# Patient Record
Sex: Female | Born: 1990 | Race: White | Hispanic: No | Smoking: Current every day smoker
Health system: Southern US, Community
[De-identification: ages and names within clinical notes are randomized; demographics above are authoritative.]

## PROBLEM LIST (undated history)

## (undated) DIAGNOSIS — F32A Depression, unspecified: Secondary | ICD-10-CM

## (undated) DIAGNOSIS — F329 Major depressive disorder, single episode, unspecified: Secondary | ICD-10-CM

## (undated) DIAGNOSIS — M549 Dorsalgia, unspecified: Secondary | ICD-10-CM

## (undated) DIAGNOSIS — F319 Bipolar disorder, unspecified: Secondary | ICD-10-CM

## (undated) DIAGNOSIS — G8929 Other chronic pain: Secondary | ICD-10-CM

## (undated) HISTORY — DX: Dorsalgia, unspecified: M54.9

## (undated) HISTORY — PX: CHOLECYSTECTOMY: SHX55

## (undated) HISTORY — DX: Major depressive disorder, single episode, unspecified: F32.9

## (undated) HISTORY — PX: TONSILLECTOMY: SUR1361

## (undated) HISTORY — DX: Depression, unspecified: F32.A

## (undated) HISTORY — DX: Other chronic pain: G89.29

## (undated) HISTORY — DX: Bipolar disorder, unspecified: F31.9

---
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
## 2010-11-20 NOTE — Op Note (Signed)
Linda [place], Linda Jackson              ACCOUNT NO.:  [id number]   MEDICAL RECORD NO.:  [id number]          PATIENT TYPE:  INP   LOCATION:  1305                         FACILITY:  [facility]   PHYSICIAN:  John C. Madilyn Fireman, M.D.    DATE OF BIRTH:  October 25, 1990   DATE OF PROCEDURE:  09/17/2007  DATE OF DISCHARGE:                               OPERATIVE REPORT   PROCEDURE:  Endoscopic retrograde cholangiopancreatography with  sphincterotomy and stent placement.   INDICATIONS FOR PROCEDURE:  Bile leak demonstrated on PIPIDA scan x2.   PROCEDURE:  The patient was placed in the prone position and placed on  the pulse monitor with continuous low-flow oxygen delivered by nasal  cannula. She was sedated with 125 mcg IV fentanyl  and 12.5 mg IV  Versed. The Olympus video side-viewing endoscope was advanced blindly  into the oropharynx, esophagus and stomach.  The pylorus was traversed  and papilla of Vater located on the medial duodenal wall and had a  normal appearance.  Decannulation was achieved with the Wilson-Cook  sphincterotome and no dye was injected into the pancreatic duct. The  cholangiogram was obtained which appeared essentially normal. I did not  try to force visualization of the bile leak and did not see any definite  source of the leak. Small sphincterotomy was made and then a 5 cm 10-  French stent was placed with good drainage. There was some debris  yellowish sludge seen to come out through the stent.   IMPRESSION:  Bile leak status post stent placement with otherwise normal  common bile duct but some  small stones or debris seen to empty through  the papilla.   PLAN:  Will leave the stent in place for 1-4 months depending on the  patient's clinical response and then will remove and sweep the duct  clean of any further stone debris.           ______________________________  Linda All. Madilyn Fireman, M.D.     JCH/MEDQ  D:  09/17/2007  T:  09/18/2007  Job:  [id number]

## 2010-11-20 NOTE — Op Note (Signed)
NAMECARMIE, Linda Jackson              ACCOUNT NO.:  [id number]   MEDICAL RECORD NO.:  [id number]          PATIENT TYPE:  AMB   LOCATION:  ENDO                         FACILITY:  [facility]   PHYSICIAN:  John C. Madilyn Fireman, M.D.    DATE OF BIRTH:  October 12, 1990   DATE OF PROCEDURE:  11/26/2007  DATE OF DISCHARGE:                               OPERATIVE REPORT   PROCEDURE:  Endoscopic retrograde cholangiopancreatography with stent  removal.   INDICATIONS FOR PROCEDURE:  Previous bile leak with endoscopic stent  placed in March.   PROCEDURE IN DETAIL:  The patient was placed in the prone position and  placed on the pulse monitor with continuous low flow oxygen delivered by  nasal cannula.  She was sedated with 150 mcg IV fentanyl, 15 mg IV  Versed and 50 mg IV Benadryl.  The Olympus side-viewing endoscope was  advanced under direct vision into the oropharynx, esophagus and stomach.  The pylorus was traversed and papilla of Vater located on the medial  duodenal wall and had normal appearance and a cholangiogram was obtained  through the stent which showed some possible small filling defects  representing either tiny stones or air bubbles.  The stent was then  removed with the snare and the sphincterotome reintroduced and  cholangiogram was obtained again showing very small filling defects,  unclear whether bubbles or stones.  The sphincterotomy was not enlarged.  An adjustable balloon catheter was placed high in the common hepatic  duct and dragged down several times with air bubbles seen to be  delivered but no stones.  After the last attempt an occlusion  cholangiogram was obtained which showed no further filling defect.  The  scope was then withdrawn and the patient returned to their recovery room  in stable condition.  She tolerated the procedure well and there were no  immediate complications.   IMPRESSION:  Successful removal of biliary stent with no definite stones  seen and no evidence of  residual bile leak.           ______________________________  Everardo All. Madilyn Fireman, M.D.     JCH/MEDQ  D:  11/26/2007  T:  11/26/2007  Job:  [id number]   cc:   Erskine Speed, M.D.  Fax: [phone]

## 2010-11-20 NOTE — H&P (Signed)
NAME:  Linda [place], Linda Jackson NO.:  [id number]   MEDICAL RECORD NO.:  [id number]          PATIENT TYPE:  EMS   LOCATION:  ED                           FACILITY:  [facility]   PHYSICIAN:  Bernette Redbird, M.D.   DATE OF BIRTH:  1991/06/09   DATE OF ADMISSION:  09/16/2007  DATE OF DISCHARGE:                              HISTORY & PHYSICAL   This 20 year old female is being admitted to the hospital because of  right upper quadrant pain and a possible bile leak.   Linda [place] is status post a laparoscopic cholecystectomy performed in [facility]  by Dr. Gabriel Cirri on September 04, 2007.  Apparently some stones were  present and the procedure went smoothly in that she was discharged the  same day as the surgery and had what sounds like a very normal  postoperative convalescence. She was thus doing well and off all pain  medications as of several days ago until approximately 2:00 p.m.  yesterday when she had the fairly abrupt onset of right upper quadrant  pain which became increasingly severe and prompted going to the [facility] emergency room.  This pain came on after helping to move some  furniture, although without actual heavy lifting but more just pushing  furniture around their new apartment.   At the [facility] ER, the patient had leukocytosis with a white count of  16,400 but normal liver chemistries and lipase.  Alk phos was minimally  elevated at 133 but I do not consider that significant with other liver  chemistries being normal.   She underwent a hepatobiliary scan which, by verbal report from the ER  physician, showed changes compatible with a bile leak and it was thus  felt that she needed an ERCP and stent placement.  The local  gastroenterologist in [place] is out of town for the week, the  gastroenterologist on call in [place] does not treat patients under  age 50, and thus we were called and asked to assume the patient's care.  Arrangements were thus made for her to be  transferred from the [facility] emergency room to [facility] to our service.   The patient got Vicodin and morphine prior to transfer and at this time  her pain level has dropped from approximately 9 on a scale of 10 to  about 4 or 5.   The pain has not been associated with nausea, vomiting or fevers.   PAST MEDICAL HISTORY:  The patient indicates that she had an allergy to  CEFZIL as young child, characterized by hives. Note that since then, she  has used penicillin and, very recently Augmentin for UTI, without any  allergic reaction.   OUTPATIENT MEDICATIONS:  1. Singulair 10 mg daily.  2. Effexor XL 150 mg daily.  3. Zyrtec 10 mg daily.  4. Relpax 40 mg as needed for migraine.  5. Birth control pills to regulate her cycles.   OPERATIONS:  Myringotomy and tonsillectomy.   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  No coronary disease, COPD, diabetes, hypertension or  other significant medical illnesses.   HABITS:  Nonsmoker, nondrinker.   FAMILY HISTORY:  Apparently a number family members have had gallbladder  trouble.   SOCIAL HISTORY:  The patient is home schooled. Her mother had a brain  tumor and is on disability.  She is being home schooled by her mother  and is at the 11th grade level.   REVIEW OF SYSTEMS:  The patient does not have chronic GI complaints such  as dyspepsia, stomach problems, trouble swallowing, constipation or  diarrhea.   PHYSICAL EXAM:  Linda [place] is a mildly overweight, pleasant, articulate,  Caucasian female really in absolutely no acute distress at the time my  evaluation, sitting up on the side of the bed.  VITAL SIGNS:  Temperature 99.3, blood pressure 126/83, pulse 78,  respirations 20 and unlabored. O2 sat 99%.  HEENT:  The mouth is dry but the oropharynx  is benign.  The sclerae are  anicteric.  She is without evident pallor.  CHEST:  Clear to auscultation, no wheezes.  NECK:  No adenopathy appreciated.  HEART:  Rate approximately 100, no murmurs, gallops or clicks  appreciated. Respiratory variation in heart rate noted.  ABDOMEN:  Mildly adipose, quiet bowel sounds, moderate subjective  tenderness to palpation especially in the right upper quadrant region,  but without evident guarding and without rigidity or rebound. No mass  effect or organomegaly appreciated.  NEUROLOGIC:  The patient is alert, oriented, coherent and appropriate  without obvious cranial nerve or focal motor deficits.   LABORATORY DATA:  Labs obtained approximately 8 hours ago at the  [facility] emergency room are pertinent as mentioned per HPI.  BUN and  creatinine are normal.  Glucose was 118.  Amylase and lipase are normal,  bilirubin is 0.4, AST normal at 21, ALT normal at 24, alk phos slightly  elevated at 133 (normal up to 92), white count 16,400 with hemoglobin of  14.8 and platelets normal at 276,000.  Urine pregnancy test is negative.   IMPRESSION:  Upper abdominal pain of fairly abrupt onset occurring about  10 days following uneventful laparoscopic cholecystectomy.  Positive  HIDA scan raises the question of a dilated bile leak which, considering  the time course, would seem to correlate with perhaps dislodgement of a  clip on the cystic duct rather than any intraoperative injury.  I wonder  if the problem may be resolving on its own in that the patient is now  several hours out from her most recent pain medication dose and yet is  in really no distress and has a fairly benign exam.   PLAN:  1. Await updated labs.  In particular, look for elevation of liver      chemistries which might signal a common duct stone or other form of      obstruction which could precipitate a bile leak.  2. As added evaluation of the patient's abdominal pain, I will obtain       a CT scan this morning.  3. At the moment, I do not see an indication for antibiotic therapy.      If she needs it, Unasyn would probably be a good choice based on      her recent tolerance of Augmentin by her history.  Dr. Bosie Clos      will be making morning rounds a few hours from now and can help  formulate a plan based on her clinical evolution.  I have      discussed,      in detail and with the assistance of a diagram, the nature, purpose      and risks of ERCP sphincterotomy and stent placement with the      patient and her father.  However, it is actually her mother who is      her legal guardian since her mother and father are divorced and      thus consent would have to be obtained from the patient's mother.           ______________________________  Bernette Redbird, M.D.     RB/MEDQ  D:  09/16/2007  T:  09/17/2007  Job:  [id number]   cc:   Samuel Jester  Fax: [phone]   Erskine Speed, M.D.  Fax: [phone]

## 2010-11-23 NOTE — Discharge Summary (Signed)
NAME:  Linda [place], Linda Jackson NO.:  [id number]   MEDICAL RECORD NO.:  [id number]          PATIENT TYPE:  INP   LOCATION:  1305                         FACILITY:  [facility]   PHYSICIAN:  Graylin Shiver, M.D.   DATE OF BIRTH:  1990-11-29   DATE OF ADMISSION:  09/16/2007  DATE OF DISCHARGE:  09/22/2007                               DISCHARGE SUMMARY   REASON FOR ADMISSION:  The patient is a 20 year old female who was  admitted to the hospital because of right upper quadrant pain and a  possible bile leak.  She was status post laparoscopic cholecystectomy on  February 27 by Dr. Gabriel Cirri in [place].  Per history, the surgery went well  but she went to the [facility] emergency room where she was evaluated.  She had a hepatobiliary scan and the verbal report that we got was that  she had a bile leak.  She was transferred here because it was felt she  needed an ERCP.   PHYSICAL:  See history and physical on chart   HOSPITAL COURSE:  The patient underwent a repeat HIDA scan here in the  hospital and she also had a CT scan.  There was, on CT scan, a fluid  collection in the gallbladder fossa.  The repeat HIDA scan here was  positive for bile leak.   The patient underwent ERCP with sphincterotomy and biliary stent  placement by Dr. Dorena Cookey on September 17, 2007.   She did well after the procedure, although she did continue to have some  pains in her abdomen and she was using a moderate amount of narcotic  analgesics.  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  She subsequently did well, however, we were able to get her off of her  IV narcotics.  She was then discharged in good condition on September 22, 2007.   FINAL DIAGNOSIS:  Bile leak.   PROCEDURES:  ERCP with sphincterotomy and biliary stent placement.   PLAN:  Was to discharge home and to follow up with Dr. Dorena Cookey in a  few weeks to discuss the further plan and see how she was doing.  His  initial plan from the  ERCP note was to remove the biliary stent anywhere  from 1 to 4 months postprocedure depending on how she did clinically.  Her diet was as tolerated, activity as tolerated.  She was given a  prescription for few Percocet.           ______________________________  Graylin Shiver, M.D.     SFG/MEDQ  D:  10/02/2007  T:  10/03/2007  Job:  [id number]   cc:   Everardo All. Madilyn Fireman, M.D.  Fax: [phone]   Samuel Jester  Fax: [phone]   Erskine Speed, M.D.  Fax: [phone]

## 2011-04-01 LAB — COMPREHENSIVE METABOLIC PANEL
ALT: 38 — ABNORMAL HIGH
ALT: 40 — ABNORMAL HIGH
ALT: 49 — ABNORMAL HIGH
ALT: 56 — ABNORMAL HIGH
AST: 23
AST: 28
AST: 39 — ABNORMAL HIGH
AST: 91 — ABNORMAL HIGH
AST: 28
Albumin: 2.8 — ABNORMAL LOW
Albumin: 2.8 — ABNORMAL LOW
Albumin: 2.8 — ABNORMAL LOW
Albumin: 3.6
Albumin: 3 — ABNORMAL LOW
Alkaline Phosphatase: 131 — ABNORMAL HIGH
Alkaline Phosphatase: 146 — ABNORMAL HIGH
Alkaline Phosphatase: 162 — ABNORMAL HIGH
Alkaline Phosphatase: 171 — ABNORMAL HIGH
BUN: 3 — ABNORMAL LOW
BUN: <1 — ABNORMAL LOW
BUN: <1 — ABNORMAL LOW
BUN: <1 — ABNORMAL LOW
CO2: 24
CO2: 29
CO2: 30
CO2: 32
CO2: 28
Calcium: 8.6
Calcium: 8.9
Calcium: 9
Calcium: 9.2
Calcium: 9.2
Chloride: 101
Chloride: 102
Chloride: 105
Chloride: 105
Creatinine, Ser: 0.56
Creatinine, Ser: 0.58
Creatinine, Ser: 0.65
Creatinine, Ser: 0.7
Creatinine, Ser: 0.54
Glucose, Bld: 79
Glucose, Bld: 88
Glucose, Bld: 93
Glucose, Bld: 99
Potassium: 3.5
Potassium: 3.6
Potassium: 3.7
Potassium: 3.8
Sodium: 134 — ABNORMAL LOW
Sodium: 137
Sodium: 141
Sodium: 142
Sodium: 140
Total Bilirubin: 0.7
Total Bilirubin: 0.7
Total Bilirubin: 0.9
Total Bilirubin: 1.8 — ABNORMAL HIGH
Total Protein: 5.7 — ABNORMAL LOW
Total Protein: 5.7 — ABNORMAL LOW
Total Protein: 5.8 — ABNORMAL LOW
Total Protein: 7.1
Total Protein: 6

## 2011-04-01 LAB — DIFFERENTIAL
Basophils Absolute: 0
Basophils Absolute: 0
Basophils Absolute: 0.1
Basophils Absolute: 0.1
Basophils Relative: 0
Basophils Relative: 0
Basophils Relative: 1
Basophils Relative: 1
Eosinophils Absolute: 0
Eosinophils Absolute: 0.2
Eosinophils Absolute: 0.3
Eosinophils Absolute: 0.4
Eosinophils Relative: 0
Eosinophils Relative: 2
Eosinophils Relative: 3
Eosinophils Relative: 5
Lymphocytes Relative: 17 — ABNORMAL LOW
Lymphocytes Relative: 30
Lymphocytes Relative: 9 — ABNORMAL LOW
Lymphocytes Relative: 35
Lymphs Abs: 1.6
Lymphs Abs: 1.9
Lymphs Abs: 2.6
Lymphs Abs: 2.9
Monocytes Absolute: 0.9
Monocytes Absolute: 1
Monocytes Absolute: 1.2
Monocytes Absolute: 1.2
Monocytes Relative: 10
Monocytes Relative: 11
Monocytes Relative: 7
Monocytes Relative: 8
Monocytes Relative: 8
Neutro Abs: 15.8 — ABNORMAL HIGH
Neutro Abs: 4.8
Neutro Abs: 7.8
Neutrophils Relative %: 56
Neutrophils Relative %: 70
Neutrophils Relative %: 85 — ABNORMAL HIGH

## 2011-04-01 LAB — CBC
HCT: 34.1 — ABNORMAL LOW
HCT: 35.1 — ABNORMAL LOW
HCT: 35.8 — ABNORMAL LOW
HCT: 42.3
Hemoglobin: 11.9 — ABNORMAL LOW
Hemoglobin: 12.1
Hemoglobin: 12.2
Hemoglobin: 14.8
MCHC: 33.9
MCHC: 34.7
MCHC: 34.9
MCHC: 34.9
MCHC: 35.6
MCV: 89.9
MCV: 90.9
MCV: 91.1
MCV: 91.2
MCV: 89.6
Platelets: 224
Platelets: 245
Platelets: 258
Platelets: 294
Platelets: 295
RBC: 3.75 — ABNORMAL LOW
RBC: 3.85
RBC: 3.93
RBC: 4.7
RDW: 12.1
RDW: 12.2
RDW: 12.4
RDW: 12.4
RDW: 12.1
WBC: 11.1
WBC: 12.2
WBC: 18.6 — ABNORMAL HIGH
WBC: 8.7

## 2011-04-01 LAB — PROTIME-INR
INR: 1
INR: 1.1
Prothrombin Time: 13.8
Prothrombin Time: 14.7

## 2011-04-01 LAB — ABO/RH: ABO/RH(D): O POS

## 2011-04-01 LAB — TYPE AND SCREEN
ABO/RH(D): O POS
Antibody Screen: NEGATIVE

## 2011-04-01 LAB — LIPASE, BLOOD: Lipase: 13

## 2011-04-01 LAB — AMYLASE: Amylase: 45

## 2011-04-03 LAB — DIFFERENTIAL
Basophils Absolute: 0.1
Lymphocytes Relative: 31
Neutro Abs: 6.4

## 2011-04-03 LAB — COMPREHENSIVE METABOLIC PANEL
Albumin: 3.3 — ABNORMAL LOW
BUN: 3 — ABNORMAL LOW
CO2: 25
Chloride: 106
Creatinine, Ser: 0.75
Total Bilirubin: 0.7

## 2011-04-03 LAB — CBC
HCT: 37.3
MCHC: 34.8
MCV: 89.9
Platelets: 272
WBC: 10.7

## 2011-04-03 LAB — LIPASE, BLOOD: Lipase: 14

## 2013-05-13 ENCOUNTER — Encounter (HOSPITAL_COMMUNITY): Payer: Self-pay | Admitting: Emergency Medicine

## 2013-05-13 ENCOUNTER — Emergency Department (HOSPITAL_COMMUNITY)
Admission: EM | Admit: 2013-05-13 | Discharge: 2013-05-13 | Disposition: A | Discharge status: Home / Self Care | Payer: Self-pay | Attending: Emergency Medicine | Admitting: Emergency Medicine

## 2013-05-13 ENCOUNTER — Emergency Department (HOSPITAL_COMMUNITY): Payer: Self-pay

## 2013-05-13 DIAGNOSIS — S300XXA Contusion of lower back and pelvis, initial encounter: Secondary | ICD-10-CM

## 2013-05-13 DIAGNOSIS — R110 Nausea: Secondary | ICD-10-CM | POA: Insufficient documentation

## 2013-05-13 DIAGNOSIS — W1809XA Striking against other object with subsequent fall, initial encounter: Secondary | ICD-10-CM | POA: Insufficient documentation

## 2013-05-13 DIAGNOSIS — Y9389 Activity, other specified: Secondary | ICD-10-CM | POA: Insufficient documentation

## 2013-05-13 DIAGNOSIS — S20229A Contusion of unspecified back wall of thorax, initial encounter: Secondary | ICD-10-CM | POA: Insufficient documentation

## 2013-05-13 DIAGNOSIS — Y92009 Unspecified place in unspecified non-institutional (private) residence as the place of occurrence of the external cause: Secondary | ICD-10-CM | POA: Insufficient documentation

## 2013-05-13 DIAGNOSIS — M5380 Other specified dorsopathies, site unspecified: Secondary | ICD-10-CM | POA: Insufficient documentation

## 2013-05-13 DIAGNOSIS — F17200 Nicotine dependence, unspecified, uncomplicated: Secondary | ICD-10-CM | POA: Insufficient documentation

## 2013-05-13 DIAGNOSIS — W19XXXA Unspecified fall, initial encounter: Secondary | ICD-10-CM

## 2013-05-13 MED ORDER — HYDROCODONE-ACETAMINOPHEN 5-325 MG PO TABS: 1.0000 | ORAL_TABLET | ORAL | Status: DC | PRN

## 2013-05-13 MED ORDER — HYDROCODONE-ACETAMINOPHEN 5-325 MG PO TABS
1.0000 | ORAL_TABLET | Freq: Once | ORAL | Status: AC
  Administered 2013-05-13: 1 via ORAL
  Filled 2013-05-13: qty 1

## 2013-05-13 NOTE — ED Notes (Signed)
Pt was carrying a heavy box and fell, Hit her head, no LOC.  Low back pain.  No neck pain.

## 2013-05-13 NOTE — ED Provider Notes (Signed)
CSN: [id number]     Arrival date & time 05/13/13  1903 History   First MD Initiated Contact with Patient 05/13/13 1934     Chief Complaint  Patient presents with  . Fall   (Consider location/radiation/quality/duration/timing/severity/associated sxs/prior Treatment) Patient is a 22 y.o. female presenting with fall. The history is provided by the patient.  Fall This is a new problem. The current episode started today. The problem occurs constantly. The problem has been unchanged. Associated symptoms include nausea. Pertinent negatives include no abdominal pain, chest pain, chills, fever, headaches, neck pain or vomiting. The symptoms are aggravated by twisting, walking and bending. She has tried nothing for the symptoms.   JULE WHITSEL is a 22 y.o. female who presents to the ED with low back pain after falling approximately 4 hours prior to arrival to the ED. She usually takes extra strength tylenol every day but did not take any today. She states that she was carrying a heavy box and fell. She hit her head on the porch rail and then landed on her lower back. She denies LOC or headache. She denies neck pain. She has had no nausea until after she arrived here and she thinks the pain has made her a little nauseated.   History reviewed. No pertinent past medical history. Past Surgical History  Procedure Laterality Date  . Tonsillectomy    . Cholecystectomy     History reviewed. No pertinent family history. History  Substance Use Topics  . Smoking status: Current Every Day Smoker  . Smokeless tobacco: Not on file  . Alcohol Use: Yes   OB History   Grav Para Term Preterm Abortions TAB SAB Ect Mult Living                 Review of Systems  Constitutional: Negative for fever and chills.  Eyes: Negative for photophobia, pain and visual disturbance.  Respiratory: Negative for shortness of breath.   Cardiovascular: Negative for chest pain.  Gastrointestinal: Positive for nausea. Negative  for vomiting and abdominal pain.  Genitourinary: Negative for dysuria and frequency.  Musculoskeletal: Positive for back pain. Negative for neck pain and neck stiffness.  Skin: Negative for wound.  Allergic/Immunologic: Negative for immunocompromised state.  Neurological: Negative for dizziness, syncope and headaches.  Psychiatric/Behavioral: The patient is not nervous/anxious.     Allergies  Cefzil and Morphine and related  Home Medications  No current outpatient prescriptions on file. BP 118/77  Pulse 77  Temp(Src) 98.1 F (36.7 C) (Oral)  Resp 20  Ht 5\' 1"  (1.549 m)  Wt 179 lb (81.194 kg)  BMI 33.84 kg/m2  SpO2 98%  LMP 05/06/2013 Physical Exam  Nursing note and vitals reviewed. Constitutional: She is oriented to person, place, and time. She appears well-developed and well-nourished.  HENT:  Head: Normocephalic.  Right Ear: Tympanic membrane normal.  Left Ear: Tympanic membrane normal.  Nose: Nose normal.  Mouth/Throat: Uvula is midline, oropharynx is clear and moist and mucous membranes are normal.  Eyes: Conjunctivae and EOM are normal. Pupils are equal, round, and reactive to light.  Neck: Normal range of motion. Neck supple.  Cardiovascular: Normal rate and regular rhythm.   Pulmonary/Chest: Effort normal and breath sounds normal.  Abdominal: Soft. Bowel sounds are normal. There is no tenderness.  Musculoskeletal: Normal range of motion.       Lumbar back: She exhibits tenderness, pain and spasm. She exhibits normal range of motion and normal pulse.       Back:  Hx of scoliosis. Pedal pulses equal, adequate circulation.   Neurological: She is alert and oriented to person, place, and time. She has normal strength and normal reflexes. No cranial nerve deficit or sensory deficit. Gait normal.  Skin: Skin is warm and dry.  Psychiatric: She has a normal mood and affect. Her behavior is normal.    ED Course  Procedures  Dg Lumbar Spine Complete  05/13/2013    CLINICAL DATA:  Acute low back pain following a fall today.  EXAM: LUMBAR SPINE - COMPLETE 4+ VIEW  COMPARISON:  11/30/2011.  FINDINGS: Mild levoconvex curve of the lumbar spine was present on the prior CT from 2013 and is probably normal for this patient. Cholecystectomy clips are present in the right upper quadrant. Vertebral body height and intervertebral disc spaces appear within normal limits. There are no pars defects. The lumbosacral junction appears normal. No sacral fracture is identified.  IMPRESSION: Negative.   Electronically Signed   By: Andreas Newport M.D.   On: 05/13/2013 20:39    MDM  22 y.o. female with low back pain s/p fall a few hours earlier. Patient stable for discharge home without any immediate complications. She remains neurovascularly intact. Will treat pain and she will follow up with her PCP or ortho as needed.  Discussed with the patient and all questioned fully answered. She will return if any problems arise.    Medication List    TAKE these medications       HYDROcodone-acetaminophen 5-325 MG per tablet  Commonly known as:  NORCO/VICODIN  Take 1 tablet by mouth every 4 (four) hours as needed.      ASK your doctor about these medications       acetaminophen 500 MG tablet  Commonly known as:  TYLENOL  Take 2,000 mg by mouth every 12 (twelve) hours as needed for mild pain, moderate pain or headache.     ibuprofen 200 MG tablet  Commonly known as:  ADVIL,MOTRIN  Take 800 mg by mouth 2 (two) times daily as needed for fever, headache, mild pain, moderate pain or cramping.           Janne Napoleon, [place] 05/15/13 [id number]

## 2013-05-15 NOTE — ED Provider Notes (Signed)
Medical screening examination/treatment/procedure(s) were performed by non-physician practitioner and as supervising physician I was immediately available for consultation/collaboration.  EKG Interpretation   None        Geoffery Lyons, MD 05/15/13 2351

## 2013-07-08 NOTE — L&D Delivery Note (Signed)
Delivery Note At 2:48 PM a viable and healthy female was delivered via  (Presentation:cephalic; right occipital anterior).  APGAR: 9,9; weight- pending .   Placenta status: intact,Shultz.  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  Anesthesia:   Episiotomy: none Lacerations: none   Suture Repair: none Est. Blood Loss (mL): 200  Mom and baby doing well, bonding FOB at bedside Mom to postpartum.  Baby to Couplet care / Skin to Skin.  Linda [place] SHWON 06/11/2014, 3:07 PM

## 2013-09-10 ENCOUNTER — Emergency Department (HOSPITAL_COMMUNITY): Payer: Self-pay

## 2013-09-10 ENCOUNTER — Encounter (HOSPITAL_COMMUNITY): Payer: Self-pay | Admitting: Emergency Medicine

## 2013-09-10 ENCOUNTER — Emergency Department (HOSPITAL_COMMUNITY)
Admission: EM | Admit: 2013-09-10 | Discharge: 2013-09-10 | Disposition: A | Discharge status: Home / Self Care | Payer: Self-pay | Attending: Emergency Medicine | Admitting: Emergency Medicine

## 2013-09-10 DIAGNOSIS — Y92009 Unspecified place in unspecified non-institutional (private) residence as the place of occurrence of the external cause: Secondary | ICD-10-CM | POA: Insufficient documentation

## 2013-09-10 DIAGNOSIS — S60511A Abrasion of right hand, initial encounter: Secondary | ICD-10-CM

## 2013-09-10 DIAGNOSIS — S60229A Contusion of unspecified hand, initial encounter: Secondary | ICD-10-CM | POA: Insufficient documentation

## 2013-09-10 DIAGNOSIS — S60221A Contusion of right hand, initial encounter: Secondary | ICD-10-CM

## 2013-09-10 DIAGNOSIS — IMO0002 Reserved for concepts with insufficient information to code with codable children: Secondary | ICD-10-CM | POA: Insufficient documentation

## 2013-09-10 DIAGNOSIS — S8000XA Contusion of unspecified knee, initial encounter: Secondary | ICD-10-CM | POA: Insufficient documentation

## 2013-09-10 DIAGNOSIS — W230XXA Caught, crushed, jammed, or pinched between moving objects, initial encounter: Secondary | ICD-10-CM | POA: Insufficient documentation

## 2013-09-10 DIAGNOSIS — F17200 Nicotine dependence, unspecified, uncomplicated: Secondary | ICD-10-CM | POA: Insufficient documentation

## 2013-09-10 DIAGNOSIS — Z23 Encounter for immunization: Secondary | ICD-10-CM | POA: Insufficient documentation

## 2013-09-10 DIAGNOSIS — S8001XA Contusion of right knee, initial encounter: Secondary | ICD-10-CM

## 2013-09-10 DIAGNOSIS — Y939 Activity, unspecified: Secondary | ICD-10-CM | POA: Insufficient documentation

## 2013-09-10 MED ORDER — ACETAMINOPHEN 325 MG PO TABS
650.0000 mg | ORAL_TABLET | Freq: Once | ORAL | Status: AC
  Administered 2013-09-10: 650 mg via ORAL
  Filled 2013-09-10: qty 2

## 2013-09-10 MED ORDER — IBUPROFEN 800 MG PO TABS
800.0000 mg | ORAL_TABLET | Freq: Once | ORAL | Status: AC
  Administered 2013-09-10: 800 mg via ORAL
  Filled 2013-09-10: qty 1

## 2013-09-10 MED ORDER — TETANUS-DIPHTH-ACELL PERTUSSIS 5-2.5-18.5 LF-MCG/0.5 IM SUSP
0.5000 mL | Freq: Once | INTRAMUSCULAR | Status: AC
  Administered 2013-09-10: 0.5 mL via INTRAMUSCULAR
  Filled 2013-09-10: qty 0.5

## 2013-09-10 NOTE — Discharge Instructions (Signed)
The x-ray of your hand is negative for fracture or dislocation. Your tetanus status has been updated do to the scratches and abrasions of your hand. The x-ray of your right knee is negative for fracture, dislocation, or effusion. Please use ibuprofen every 6 hours for discomfort. May use Tylenol in between the ibuprofen doses if needed. Please allow your hand to rest on the ice pack to prevent further swelling. Please see the orthopedic specialist listed above if not improving. Abrasions An abrasion is a cut or scrape of the skin. Abrasions do not go through all layers of the skin. HOME CARE  If a bandage (dressing) was put on your wound, change it as told by your doctor. If the bandage sticks, soak it off with warm.  Wash the area with water and soap 2 times a day. Rinse off the soap. Pat the area dry with a clean towel.  Put on medicated cream (ointment) as told by your doctor.  Change your bandage right away if it gets wet or dirty.  Only take medicine as told by your doctor.  See your doctor within 24 48 hours to get your wound checked.  Check your wound for redness, puffiness (swelling), or yellowish-white fluid (pus). GET HELP RIGHT AWAY IF:   You have more pain in the wound.  You have redness, swelling, or tenderness around the wound.  You have pus coming from the wound.  You have a fever or lasting symptoms for more than 2 3 days.  You have a fever and your symptoms suddenly get worse.  You have a bad smell coming from the wound or bandage. MAKE SURE YOU:   Understand these instructions.  Will watch your condition.  Will get help right away if you are not doing well or get worse. Document Released: 12/11/2007 Document Revised: 03/18/2012 Document Reviewed: 05/28/2011 [facility] Patient Information 2014 [place], [place].  Contusion A contusion is a deep bruise. Contusions are the result of an injury that caused bleeding under the skin. The contusion may turn blue,  purple, or yellow. Minor injuries will give you a painless contusion, but more severe contusions may stay painful and swollen for a few weeks.  CAUSES  A contusion is usually caused by a blow, trauma, or direct force to an area of the body. SYMPTOMS   Swelling and redness of the injured area.  Bruising of the injured area.  Tenderness and soreness of the injured area.  Pain. DIAGNOSIS  The diagnosis can be made by taking a history and physical exam. An X-ray, CT scan, or MRI may be needed to determine if there were any associated injuries, such as fractures. TREATMENT  Specific treatment will depend on what area of the body was injured. In general, the best treatment for a contusion is resting, icing, elevating, and applying cold compresses to the injured area. Over-the-counter medicines may also be recommended for pain control. Ask your caregiver what the best treatment is for your contusion. HOME CARE INSTRUCTIONS   Put ice on the injured area.  Put ice in a plastic bag.  Place a towel between your skin and the bag.  Leave the ice on for 15-20 minutes, 03-04 times a day.  Only take over-the-counter or prescription medicines for pain, discomfort, or fever as directed by your caregiver. Your caregiver may recommend avoiding anti-inflammatory medicines (aspirin, ibuprofen, and naproxen) for 48 hours because these medicines may increase bruising.  Rest the injured area.  If possible, elevate the injured area to reduce  swelling. SEEK IMMEDIATE MEDICAL CARE IF:   You have increased bruising or swelling.  You have pain that is getting worse.  Your swelling or pain is not relieved with medicines. MAKE SURE YOU:   Understand these instructions.  Will watch your condition.  Will get help right away if you are not doing well or get worse. Document Released: 04/03/2005 Document Revised: 09/16/2011 Document Reviewed: 04/29/2011 [facility] Patient Information 2014 [place], [place].

## 2013-09-10 NOTE — ED Notes (Signed)
Pt seen and evaluated by EDPa for initial assessment. 

## 2013-09-10 NOTE — ED Provider Notes (Signed)
CSN: [id number]     Arrival date & time 09/10/13  1628 History   This chart was scribed for Ivery Quale by Ladona Ridgel Day, ED scribe. This patient was seen in room APFT23/APFT23 and the patient's care was started at 1628.  Chief Complaint  Patient presents with  . Hand Injury   The history is provided by the patient. No language interpreter was used.   HPI Comments: Linda Jackson is a 23 y.o. female who presents to the Emergency Department complaining of sudden onset left hand pain after it was slammed in her front door at home, occurred 3 hours ago and having constant moderate pain to her left hand. She denies any blood thinner medicines or any previous operations/surgeries of her left hand. She denies any other injuries proximal to her left hand.  She reports has not updated her Tdap in the last 5 years.  History reviewed. No pertinent past medical history. Past Surgical History  Procedure Laterality Date  . Tonsillectomy    . Cholecystectomy     History reviewed. No pertinent family history. History  Substance Use Topics  . Smoking status: Current Every Day Smoker  . Smokeless tobacco: Not on file  . Alcohol Use: Yes   OB History   Grav Para Term Preterm Abortions TAB SAB Ect Mult Living                 Review of Systems  Constitutional: Negative for fever and chills.  Respiratory: Negative for cough and shortness of breath.   Cardiovascular: Negative for chest pain.  Gastrointestinal: Negative for abdominal pain.  Musculoskeletal: Negative for back pain.       Left hand pain. Right knee pain  All other systems reviewed and are negative.   Allergies  Morphine and related and Cefzil  Home Medications   Current Outpatient Rx  Name  Route  Sig  Dispense  Refill  . acetaminophen (TYLENOL) 500 MG tablet   Oral   Take 1,000 mg by mouth daily as needed for mild pain, moderate pain or headache.           Triage Vitals: BP 121/80  Pulse 87  Temp(Src) 98.4 F (36.9  C) (Oral)  Resp 20  Ht 5\' 1"  (1.549 m)  Wt 167 lb (75.751 kg)  BMI 31.57 kg/m2  SpO2 98%  LMP 09/01/2013  Physical Exam  Nursing note and vitals reviewed. Constitutional: She is oriented to person, place, and time. She appears well-developed and well-nourished. No distress.  HENT:  Head: Normocephalic and atraumatic.  Eyes: Conjunctivae are normal.  Neck: Normal range of motion.  Cardiovascular: Normal rate.   Pulmonary/Chest: Effort normal. No respiratory distress.  Musculoskeletal: Normal range of motion. She exhibits tenderness. She exhibits no edema.  Full ROM of her left shoulder, no evidnce of dislocation. Full ROM of the left elbow. No pain at the olecranon. No deformity of the forearm. No palpable deformity of the wrist. No pain in the anatomic snuff box.   Abrasions of the 23nd 3rd and 4th finger. Decreased ROM of the PIP joint of the 3rd digit due to pain.  Mild crepitus of the right knee. Patella is midline. No effusion. No posterior mass. No laxity of the joint. Full ROM of the right hip    Neurological: She is alert and oriented to person, place, and time.  Skin: Skin is warm and dry.  Psychiatric: She has a normal mood and affect. Thought content normal.   ED Course  Procedures (including critical care time) DIAGNOSTIC STUDIES: Oxygen Saturation is 98% on room air, normal by my interpretation.    COORDINATION OF CARE: At 505 PM Discussed treatment plan with patient which includes update Tdap and perform left hand X-ray and right knee X-ray. Patient agrees.   605 PM I discussed X-ray results of both her left hand and right knee which demonstrate no acute abnormalities or fracture. She states would also like a work note since she does physical tasks at work. I also encouraged her to apply ice packs over her injuries to aid in recovery and help with pain relief. She understands and is agreeable. She also describes that she is worried about the pain of her right knee  and worried that she may have an underlying problem. I discussed with her that following up with an orthopedist is the next step for evaluating her knee pain.  Labs Review Labs Reviewed - No data to display Imaging Review No results found.   EKG Interpretation None      MDM Pt sustained an injury to the left hand after it was mashed in a house door. The patient states she then" went to her knees" and injured the right knee. X-ray of the left hand is negative for fracture or dislocation. X-ray of the left knee is negative for fracture or dislocation or effusion. The patient states she has been having some problems with her her right knee and is concerned about it hurting when she resumes work. The patient is referred to orthopedics. She's given a work note to return on March 10. Patient is advised to use ibuprofen every 6 hours, she is advised to use Tylenol in between the ibuprofen doses. Questions were answered. Patient seems to understand the discharge plan at this time.    Final diagnoses:  None    *I have reviewed nursing notes, vital signs, and all appropriate lab and imaging results for this patient.** I personally performed the services described in this documentation, which was scribed in my presence. The recorded information has been reviewed and is accurate.      Kathie DikeHobson M Rosalynn Sergent, PA-C 09/10/13 [phone]

## 2013-09-10 NOTE — ED Notes (Signed)
Lt hand injury,  Door slammed shut on lt hand and pt "went down to my knees. ". Pain  Rt knee.

## 2013-09-11 NOTE — ED Provider Notes (Signed)
Medical screening examination/treatment/procedure(s) were performed by non-physician practitioner and as supervising physician I was immediately available for consultation/collaboration.   EKG Interpretation None        Rosio Weiss N Tanner Vigna, DO 09/11/13 0112 

## 2013-11-02 ENCOUNTER — Emergency Department (HOSPITAL_COMMUNITY)
Admission: EM | Admit: 2013-11-02 | Discharge: 2013-11-03 | Disposition: A | Discharge status: Home / Self Care | Payer: Self-pay | Attending: Emergency Medicine | Admitting: Emergency Medicine

## 2013-11-02 ENCOUNTER — Encounter (HOSPITAL_COMMUNITY): Payer: Self-pay | Admitting: Emergency Medicine

## 2013-11-02 DIAGNOSIS — R8271 Bacteriuria: Secondary | ICD-10-CM

## 2013-11-02 DIAGNOSIS — O99330 Smoking (tobacco) complicating pregnancy, unspecified trimester: Secondary | ICD-10-CM | POA: Insufficient documentation

## 2013-11-02 DIAGNOSIS — O9989 Other specified diseases and conditions complicating pregnancy, childbirth and the puerperium: Secondary | ICD-10-CM | POA: Insufficient documentation

## 2013-11-02 DIAGNOSIS — Z3490 Encounter for supervision of normal pregnancy, unspecified, unspecified trimester: Secondary | ICD-10-CM

## 2013-11-02 DIAGNOSIS — R110 Nausea: Secondary | ICD-10-CM | POA: Insufficient documentation

## 2013-11-02 DIAGNOSIS — O99891 Other specified diseases and conditions complicating pregnancy: Secondary | ICD-10-CM | POA: Insufficient documentation

## 2013-11-02 DIAGNOSIS — R109 Unspecified abdominal pain: Secondary | ICD-10-CM | POA: Insufficient documentation

## 2013-11-02 NOTE — ED Provider Notes (Signed)
CSN: [id number]     Arrival date & time 11/02/13  2316 History  This chart was scribed for Vida RollerBrian D Xaivier Malay, MD by Danella Maiersaroline Early, ED Scribe. This patient was seen in room APA09/APA09 and the patient's care was started at 11:25 PM.    Chief Complaint  Patient presents with  . Abdominal Cramping  . pregnant    The history is provided by the patient. No language interpreter was used.   HPI Comments: Linda Jackson is a 23 y.o. female who is 8-[redacted] weeks pregnant based on last period who presents to the Emergency Department complaining of suprapubic abdominal cramping onset yesterday. She states the pain is a constant dull ache with intermittent cramping. She had a positive home pregnancy test. LMP 2/27. G3 P2 2 0 0 2 at 8-9 weeks. Her period is irregular due to depo shot, last shot in September of last year. Food has no effect on the abdominal pain. She states she has been nauseous for the past week and was nauseous with the other pregnancies as well. She denies vaginal discharge, vaginal bleeding, dysuria, vomiting. She has no chronic medical problems. She has not yet seen her OB-GYN but has an appointment. She is not on fertility treatments.    History reviewed. No pertinent past medical history. Past Surgical History  Procedure Laterality Date  . Tonsillectomy    . Cholecystectomy     History reviewed. No pertinent family history. History  Substance Use Topics  . Smoking status: Current Every Day Smoker  . Smokeless tobacco: Not on file  . Alcohol Use: Yes   OB History   Grav Para Term Preterm Abortions TAB SAB Ect Mult Living                 Review of Systems  Gastrointestinal: Positive for nausea and abdominal pain. Negative for vomiting.  Genitourinary: Negative for dysuria, vaginal bleeding and vaginal discharge.  All other systems reviewed and are negative.     Allergies  Morphine and related and Cefzil  Home Medications   Prior to Admission medications   Medication  Sig Start Date End Date Taking? Authorizing Provider  acetaminophen (TYLENOL) 500 MG tablet Take 1,000 mg by mouth daily as needed for mild pain, moderate pain or headache.    Yes Historical Provider, MD   BP 124/86  Pulse 82  Temp(Src) 98.8 F (37.1 C) (Oral)  Resp 20  Ht 5\' 1"  (1.549 m)  Wt 155 lb (70.308 kg)  BMI 29.30 kg/m2  SpO2 100%  LMP 09/03/2013 Physical Exam  Nursing note and vitals reviewed. Constitutional: She appears well-developed and well-nourished. No distress.  HENT:  Head: Normocephalic and atraumatic.  Eyes: Conjunctivae are normal. Right eye exhibits no discharge. Left eye exhibits no discharge.  Cardiovascular: Normal rate and regular rhythm.   No murmur heard. Pulmonary/Chest: Effort normal and breath sounds normal.  Abdominal: Soft. She exhibits no distension. There is tenderness (miminally tenderv SP area). There is no rebound and no guarding.  No significant McBurneys point tenderness  Genitourinary:  Chaperone present for exam.  Mild CMT, green d/c in vag vault, os closed, no bleeding, no FB seen, no adnexal ttp or masses.    Musculoskeletal: She exhibits no edema and no tenderness.  Lymphadenopathy:    She has no cervical adenopathy.  Neurological: She is alert. Coordination normal.  Skin: Skin is warm. No rash noted.    ED Course  Procedures (including critical care time) Medications  acetaminophen (TYLENOL) tablet 1,000  mg (1,000 mg Oral Given 11/03/13 0047)  ondansetron (ZOFRAN-ODT) disintegrating tablet 4 mg (4 mg Oral Given 11/03/13 0048)    DIAGNOSTIC STUDIES:    COORDINATION OF CARE: 12:00 AM- Discussed treatment plan with pt. Performed bedside US  Pt agrees to plan.  EMERGENCY DEPARTMENT US PREGNANCY "Study: Limited Ultrasound of the Pelvis for Pregnancy"  INDICATIONS:Pregnancy(required) and Pelvic pain Multiple views of the uterus and pelvic cavity were obtained in real-time with a multi-frequency probe.  APPROACH:Transabdominal    PERFORMED BY: Myself  IMAGES ARCHIVED?: Yes  LIMITATIONS: Body habitus  PREGNANCY FREE FLUID: None  ADNEXAL FINDINGS:Left ovary not seen and Right ovary not seen  PREGNANCY FINDINGS: Intrauterine gestational sac noted and Fetal pole present  INTERPRETATION: Viable intrauterine pregnancy and Pelvic free fluid absent  GESTATIONAL AGE, ESTIMATE: 7 weeks  FETAL HEART RATE: Not appreciated on bedside US.  Labs Review Labs Reviewed  WET PREP, GENITAL - Abnormal; Notable for the following:    Clue Cells Wet Prep HPF POC FEW (*)    WBC, Wet Prep HPF POC FEW (*)    All other components within normal limits  URINALYSIS, ROUTINE W REFLEX MICROSCOPIC - Abnormal; Notable for the following:    Specific Gravity, Urine <1.005 (*)    Hgb urine dipstick TRACE (*)    Leukocytes, UA SMALL (*)    All other components within normal limits  HCG, QUANTITATIVE, PREGNANCY - Abnormal; Notable for the following:    hCG, Beta Chain, Quant, S 52003 (*)    All other components within normal limits  URINE MICROSCOPIC-ADD ON - Abnormal; Notable for the following:    Squamous Epithelial / LPF MANY (*)    Bacteria, UA MANY (*)    All other components within normal limits  GC/CHLAMYDIA PROBE AMP  URINE CULTURE  RPR  HIV ANTIBODY (ROUTINE TESTING)    Imaging Review No results found.   MDM   Final diagnoses:  Abdominal cramping  Pregnancy  Bacteriuria    The patient is pregnant, her bedside ultrasound reveals an intrauterine pregnancy, verify hCG quantification, urinalysis to rule out infection, pelvic exam to rule out infection, referral to local OB/GYN at the patient's request. Otherwise her abdominal discomfort is only consistent with a pregnancy and much less likely to be related to appendicitis, kidney stone or other significant pathology  Kidney also examined showing no signs of hydronephrosis.  Likely regular intrauterine pregnancy, patient appears benign, anticipate discharge home  with OB/GYN follow up  Bacteriuria present, Cx ordered   I personally performed the services described in this documentation, which was scribed in my presence. The recorded information has been reviewed and is accurate.     Meds given in ED:  Medications  acetaminophen (TYLENOL) tablet 1,000 mg (1,000 mg Oral Given 11/03/13 0047)  ondansetron (ZOFRAN-ODT) disintegrating tablet 4 mg (4 mg Oral Given 11/03/13 0048)    New Prescriptions   NITROFURANTOIN, MACROCRYSTAL-MONOHYDRATE, (MACROBID) 100 MG CAPSULE    Take 1 capsule (100 mg total) by mouth 2 (two) times daily.      Vida RollerBrian D Loomis Anacker, MD 11/03/13 [phone]

## 2013-11-02 NOTE — ED Notes (Signed)
Pt states stated cramping yesterday. Denies vaginal bleeding. Positive pregnant x2 at home

## 2013-11-03 LAB — URINALYSIS, ROUTINE W REFLEX MICROSCOPIC
Bilirubin Urine: NEGATIVE
GLUCOSE, UA: NEGATIVE mg/dL
KETONES UR: NEGATIVE mg/dL
NITRITE: NEGATIVE
PH: 6 (ref 5.0–8.0)
Protein, ur: NEGATIVE mg/dL
Urobilinogen, UA: 0.2 mg/dL (ref 0.0–1.0)

## 2013-11-03 LAB — WET PREP, GENITAL
TRICH WET PREP: NONE SEEN
YEAST WET PREP: NONE SEEN

## 2013-11-03 LAB — URINE MICROSCOPIC-ADD ON

## 2013-11-03 LAB — HCG, QUANTITATIVE, PREGNANCY: HCG, BETA CHAIN, QUANT, S: 52003 m[IU]/mL — AB (ref <5)

## 2013-11-03 LAB — RPR

## 2013-11-03 LAB — HIV ANTIBODY (ROUTINE TESTING W REFLEX): HIV: NONREACTIVE

## 2013-11-03 MED ORDER — ONDANSETRON 4 MG PO TBDP
4.0000 mg | ORAL_TABLET | Freq: Once | ORAL | Status: AC
  Administered 2013-11-03: 4 mg via ORAL
  Filled 2013-11-03: qty 1

## 2013-11-03 MED ORDER — ACETAMINOPHEN 500 MG PO TABS
1000.0000 mg | ORAL_TABLET | Freq: Once | ORAL | Status: AC
  Administered 2013-11-03: 1000 mg via ORAL
  Filled 2013-11-03: qty 2

## 2013-11-03 MED ORDER — NITROFURANTOIN MONOHYD MACRO 100 MG PO CAPS: 100.0000 mg | ORAL_CAPSULE | Freq: Two times a day (BID) | ORAL | Status: DC

## 2013-11-03 NOTE — Discharge Instructions (Signed)
Your ultrasound shows that you are pregnant, you will get a phone call to let you know if you have an STD, see your OBGYN this week for close follow up and to establish care.  Please call your doctor for a followup appointment within 24-48 hours. When you talk to your doctor please let them know that you were seen in the emergency department and have them acquire all of your records so that they can discuss the findings with you and formulate a treatment plan to fully care for your new and ongoing problems.

## 2013-11-04 LAB — URINE CULTURE

## 2013-11-04 LAB — GC/CHLAMYDIA PROBE AMP
CT Probe RNA: NEGATIVE
GC Probe RNA: NEGATIVE

## 2013-11-09 ENCOUNTER — Other Ambulatory Visit: Payer: Self-pay | Admitting: Obstetrics & Gynecology

## 2013-11-09 DIAGNOSIS — O3680X0 Pregnancy with inconclusive fetal viability, not applicable or unspecified: Secondary | ICD-10-CM

## 2013-11-11 ENCOUNTER — Other Ambulatory Visit: Payer: Self-pay

## 2013-12-09 ENCOUNTER — Emergency Department (HOSPITAL_COMMUNITY)
Admission: EM | Admit: 2013-12-09 | Discharge: 2013-12-09 | Disposition: A | Discharge status: Home / Self Care | Payer: Self-pay | Attending: Emergency Medicine | Admitting: Emergency Medicine

## 2013-12-09 ENCOUNTER — Encounter (HOSPITAL_COMMUNITY): Payer: Self-pay | Admitting: Emergency Medicine

## 2013-12-09 DIAGNOSIS — Z79899 Other long term (current) drug therapy: Secondary | ICD-10-CM | POA: Insufficient documentation

## 2013-12-09 DIAGNOSIS — O200 Threatened abortion: Secondary | ICD-10-CM | POA: Insufficient documentation

## 2013-12-09 DIAGNOSIS — O99330 Smoking (tobacco) complicating pregnancy, unspecified trimester: Secondary | ICD-10-CM | POA: Insufficient documentation

## 2013-12-09 LAB — TYPE AND SCREEN
ABO/RH(D): O POS
Antibody Screen: NEGATIVE

## 2013-12-09 LAB — HCG, QUANTITATIVE, PREGNANCY: hCG, Beta Chain, Quant, S: 18581 m[IU]/mL — ABNORMAL HIGH (ref <5)

## 2013-12-09 NOTE — ED Notes (Signed)
Pt reports some abd cramping since yesterday, states tonight approx 6 pm had a small amt of bleeding that has stopped now.  Pt states she is approx 13 weeks preg

## 2013-12-09 NOTE — Discharge Instructions (Signed)
Threatened Miscarriage °Bleeding during the first 20 weeks of pregnancy is common. This is sometimes called a threatened miscarriage. This is a pregnancy that is threatening to end before the twentieth week of pregnancy. Often this bleeding stops with bed rest or decreased activities as suggested by your caregiver and the pregnancy continues without any more problems. You may be asked to not have sexual intercourse, have orgasms or use tampons until further notice. Sometimes a threatened miscarriage can progress to a complete or incomplete miscarriage. This may or may not require further treatment. Some miscarriages occur before a woman misses a menstrual period and knows she is pregnant. °Miscarriages occur in 15 to 20% of all pregnancies and usually occur during the first 13 weeks of the pregnancy. The exact cause of a miscarriage is usually never known. A miscarriage is natures way of ending a pregnancy that is abnormal or would not make it to term. There are some things that may put you at risk to have a miscarriage, such as: °· Hormone problems. °· Infection of the uterus or cervix. °· Chronic illness, diabetes for example, especially if it is not controlled. °· Abnormal shaped uterus. °· Fibroids in the uterus. °· Incompetent cervix (the cervix is too weak to hold the baby). °· Smoking. °· Drinking too much alcohol. It's best not to drink any alcohol when you are pregnant. °· Taking illegal drugs. °TREATMENT  °When a miscarriage becomes complete and all products of conception (all the tissue in the uterus) have been passed, often no treatment is needed. If you think you passed tissue, save it in a container and take it to your doctor for evaluation. If the miscarriage is incomplete (parts of the fetus or placenta remain in the uterus), further treatment may be needed. The most common reason for further treatment is continued bleeding (hemorrhage) because pregnancy tissue did not pass out of the uterus. This  often occurs if a miscarriage is incomplete. Tissue left behind may also become infected. Treatment usually is dilatation and curettage (the removal of the remaining products of pregnancy. This can be done by a simple sucking procedure (suction curettage) or a simple scraping of the inside of the uterus. This may be done in the hospital or in the caregiver's office. This is only done when your caregiver knows that there is no chance for the pregnancy to proceed to term. This is determined by physical examination, negative pregnancy test, falling pregnancy hormone count and/or, an ultrasound revealing a dead fetus. °Miscarriages are often a very emotional time for prospective mothers and fathers. This is not you or your partners fault. It did not occur because of an inadequacy in you or your partner. Nearly all miscarriages occur because the pregnancy has started off wrongly. At least half of these pregnancies have a chromosomal abnormality. It is almost always not inherited. Others may have developmental problems with the fetus or placenta. This does not always show up even when the products miscarried are studied under the microscope. The miscarriage is nearly always not your fault and it is not likely that you could have prevented it from happening. If you are having emotional and grieving problems, talk to your health care provider and even seek counseling, if necessary, before getting pregnant again. You can begin trying for another pregnancy as soon as your caregiver says it is OK. °HOME CARE INSTRUCTIONS  °· Your caregiver may order bed rest depending on how much bleeding and cramping you are having. You may be limited   to only getting up to go to the bathroom. You may be allowed to continue light activity. You may need to make arrangements for the care of your other children and for any other responsibilities.  Keep track of the number of pads you use each day, how often you have to change pads and how  saturated (soaked) they are. Record this information.  DO NOT USE TAMPONS. Do not douche, have sexual intercourse or orgasms until approved by your caregiver.  You may receive a follow up appointment for re-evaluation of your pregnancy and a repeat blood test. Re-evaluation often occurs after 2 days and again in 4 to 6 weeks. It is very important that you follow-up in the recommended time period.  If you are Rh negative and the father is Rh positive or you do not know the fathers' blood type, you may receive a shot (Rh immune globulin) to help prevent abnormal antibodies that can develop and affect the baby in any future pregnancies. SEEK IMMEDIATE MEDICAL CARE IF:  You have severe cramps in your stomach, back, or abdomen.  You have a sudden onset of severe pain in the lower part of your abdomen.  You develop chills.  You run an unexplained temperature of 101 F (38.3 C) or higher.  You pass large clots or tissue. Save any tissue for your caregiver to inspect.  Your bleeding increases or you become light-headed, weak, or have fainting episodes.  You have a gush of fluid from your vagina.  You pass out. This could mean you have a tubal (ectopic) pregnancy. Document Released: 06/24/2005 Document Revised: 09/16/2011 Document Reviewed: 02/08/2008 [facility] Patient Information 2014 [place].  Vaginal Bleeding During Pregnancy, First Trimester A small amount of bleeding (spotting) from the vagina is relatively common in early pregnancy. It usually stops on its own. Various things may cause bleeding or spotting in early pregnancy. Some bleeding may be related to the pregnancy, and some may not. In most cases, the bleeding is normal and is not a problem. However, bleeding can also be a sign of something serious. Be sure to tell your health care provider about any vaginal bleeding right away. Some possible causes of vaginal bleeding during the first trimester include:  Infection or  inflammation of the cervix.  Growths (polyps) on the cervix.  Miscarriage or threatened miscarriage.  Pregnancy tissue has developed outside of the uterus and in a fallopian tube (tubal pregnancy).  Tiny cysts have developed in the uterus instead of pregnancy tissue (molar pregnancy). HOME CARE INSTRUCTIONS  Watch your condition for any changes. The following actions may help to lessen any discomfort you are feeling:  Follow your health care provider's instructions for limiting your activity. If your health care provider orders bed rest, you may need to stay in bed and only get up to use the bathroom. However, your health care provider may allow you to continue light activity.  If needed, make plans for someone to help with your regular activities and responsibilities while you are on bed rest.  Keep track of the number of pads you use each day, how often you change pads, and how soaked (saturated) they are. Write this down.  Do not use tampons. Do not douche.  Do not have sexual intercourse or orgasms until approved by your health care provider.  If you pass any tissue from your vagina, save the tissue so you can show it to your health care provider.  Only take over-the-counter or prescription medicines as directed  by your health care provider. °· Do not take aspirin because it can make you bleed. °· Keep all follow-up appointments as directed by your health care provider. °SEEK MEDICAL CARE IF: °· You have any vaginal bleeding during any part of your pregnancy. °· You have cramps or labor pains. °SEEK IMMEDIATE MEDICAL CARE IF:  °· You have severe cramps in your back or belly (abdomen). °· You have a fever, not controlled by medicine. °· You pass large clots or tissue from your vagina. °· Your bleeding increases. °· You feel lightheaded or weak, or you have fainting episodes. °· You have chills. °· You are leaking fluid or have a gush of fluid from your vagina. °· You pass out while having  a bowel movement. °MAKE SURE YOU: °· Understand these instructions. °· Will watch your condition. °· Will get help right away if you are not doing well or get worse. °Document Released: 04/03/2005 Document Revised: 04/14/2013 Document Reviewed: 03/01/2013 °[employer]® Patient Information ©2014 [employer]. ° °

## 2013-12-19 NOTE — ED Provider Notes (Signed)
CSN: [id number]     Arrival date & time 12/09/13  0026 History   First MD Initiated Contact with Patient 12/09/13 0052     No chief complaint on file.    (Consider location/radiation/quality/duration/timing/severity/associated sxs/prior Treatment) HPI  23 year old female with vaginal bleeding. Patient thinks she is approximately [redacted] weeks pregnant. Yesterday she began having some lower cramping. Tonight around 6 PM showed small amount of bright red vaginal bleeding. It has since stopped. No other acute complaints. No dizziness, lightheadedness or shortness of breath. No blood thinners.  History reviewed. No pertinent past medical history. Past Surgical History  Procedure Laterality Date  . Tonsillectomy    . Cholecystectomy     No family history on file. History  Substance Use Topics  . Smoking status: Current Every Day Smoker  . Smokeless tobacco: Not on file  . Alcohol Use: Yes   OB History   Grav Para Term Preterm Abortions TAB SAB Ect Mult Living   1              Review of Systems  All systems reviewed and negative, other than as noted in HPI.   Allergies  Morphine and related and Cefzil  Home Medications   Prior to Admission medications   Medication Sig Start Date End Date Taking? Authorizing Provider  diphenhydrAMINE (SOMINEX) 25 MG tablet Take 25 mg by mouth at bedtime as needed for sleep.   Yes Historical Provider, MD  acetaminophen (TYLENOL) 500 MG tablet Take 1,000 mg by mouth daily as needed for mild pain, moderate pain or headache.     Historical Provider, MD  nitrofurantoin, macrocrystal-monohydrate, (MACROBID) 100 MG capsule Take 1 capsule (100 mg total) by mouth 2 (two) times daily. 11/03/13   Vida RollerBrian D Miller, MD   BP 119/77  Pulse 97  Temp(Src) 98.9 F (37.2 C) (Oral)  Resp 16  Ht 5\' 1"  (1.549 m)  Wt 160 lb (72.576 kg)  BMI 30.25 kg/m2  SpO2 100%  LMP 09/22/2013 Physical Exam  Nursing note and vitals reviewed. Constitutional: She appears  well-developed and well-nourished. No distress.  HENT:  Head: Normocephalic and atraumatic.  Eyes: Conjunctivae are normal. Right eye exhibits no discharge. Left eye exhibits no discharge.  Neck: Neck supple.  Cardiovascular: Normal rate, regular rhythm and normal heart sounds.  Exam reveals no gallop and no friction rub.   No murmur heard. Pulmonary/Chest: Effort normal and breath sounds normal. No respiratory distress.  Abdominal: Soft. She exhibits no distension. There is no tenderness.  Genitourinary:  No costovertebral angle tenderness  Musculoskeletal: She exhibits no edema and no tenderness.  Neurological: She is alert.  Skin: Skin is warm and dry.  Psychiatric: She has a normal mood and affect. Her behavior is normal. Thought content normal.    ED Course  Procedures (including critical care time) Labs Review Labs Reviewed  HCG, QUANTITATIVE, PREGNANCY - Abnormal; Notable for the following:    hCG, Beta Chain, Quant, S 18581 (*)    All other components within normal limits  TYPE AND SCREEN    Imaging Review No results found.   EKG Interpretation None      MDM   Final diagnoses:  Threatened miscarriage    23 year old female with late first trimester vaginal bleeding. Bedside ultrasound shows an IUP with fetal movement and fetal heart rate approximately 140-150. Abdominal exam is benign. She is hemodynamically stable. Return precautions discussed. OB followup.    Raeford RazorStephen Samaa Ueda, MD 12/19/13 [phone] [id number]

## 2013-12-21 ENCOUNTER — Encounter (HOSPITAL_COMMUNITY): Payer: Self-pay | Admitting: Emergency Medicine

## 2013-12-21 ENCOUNTER — Emergency Department (HOSPITAL_COMMUNITY)
Admission: EM | Admit: 2013-12-21 | Discharge: 2013-12-21 | Disposition: A | Discharge status: Home / Self Care | Payer: Self-pay | Attending: Emergency Medicine | Admitting: Emergency Medicine

## 2013-12-21 DIAGNOSIS — R110 Nausea: Secondary | ICD-10-CM | POA: Insufficient documentation

## 2013-12-21 DIAGNOSIS — O99330 Smoking (tobacco) complicating pregnancy, unspecified trimester: Secondary | ICD-10-CM | POA: Insufficient documentation

## 2013-12-21 DIAGNOSIS — O209 Hemorrhage in early pregnancy, unspecified: Secondary | ICD-10-CM | POA: Insufficient documentation

## 2013-12-21 DIAGNOSIS — R197 Diarrhea, unspecified: Secondary | ICD-10-CM | POA: Insufficient documentation

## 2013-12-21 DIAGNOSIS — R109 Unspecified abdominal pain: Secondary | ICD-10-CM | POA: Insufficient documentation

## 2013-12-21 DIAGNOSIS — O9989 Other specified diseases and conditions complicating pregnancy, childbirth and the puerperium: Secondary | ICD-10-CM | POA: Insufficient documentation

## 2013-12-21 DIAGNOSIS — Z79899 Other long term (current) drug therapy: Secondary | ICD-10-CM | POA: Insufficient documentation

## 2013-12-21 DIAGNOSIS — Z3490 Encounter for supervision of normal pregnancy, unspecified, unspecified trimester: Secondary | ICD-10-CM

## 2013-12-21 LAB — WET PREP, GENITAL
CLUE CELLS WET PREP: NONE SEEN
TRICH WET PREP: NONE SEEN
Yeast Wet Prep HPF POC: NONE SEEN

## 2013-12-21 LAB — URINALYSIS, ROUTINE W REFLEX MICROSCOPIC
Bilirubin Urine: NEGATIVE
Glucose, UA: NEGATIVE mg/dL
Hgb urine dipstick: NEGATIVE
KETONES UR: NEGATIVE mg/dL
LEUKOCYTES UA: NEGATIVE
NITRITE: NEGATIVE
PH: 6 (ref 5.0–8.0)
Protein, ur: NEGATIVE mg/dL
Specific Gravity, Urine: 1.02 (ref 1.005–1.030)
Urobilinogen, UA: 0.2 mg/dL (ref 0.0–1.0)

## 2013-12-21 NOTE — ED Notes (Signed)
Pt states is [redacted] weeks pregnant and started having bright red vaginal bleeding today. C/o abd pain.

## 2013-12-21 NOTE — ED Provider Notes (Signed)
CSN: [id number]     Arrival date & time 12/21/13  1811 History   .This chart was scribed for Ward Givens, MD by Nicholos Johns, ED scribe. This patient was seen in room APA09/APA09 and the patient's care was started at 7:28 PM.    Chief Complaint  Patient presents with  . Vaginal Bleeding  . Routine Prenatal Visit    The history is provided by the patient. No language interpreter was used.   HPI Comments: Linda Jackson is a 23 y.o. female at [redacted] weeks pregnant presents to the Emergency Department complaining of heavy bright red vaginal bleeding; onset 2.5 hours ago at 5 pm tonight. Also reports dull RUQ pain and abdominal pressure. 2 episodes of diarrhea; onset last night. Normal nausea throughout pregnancy. Pt is G3P2Ab0. States her LNP started the last week of February.  Reports severe bleeding during the middle and end of the second pregnancy; states the placenta detached 1 week before her due date. That child is now 16-year-old. Has had no prenatal care for this pregnancy due to lack on insurance. Called Dr. Rayna Sexton office to get an appointment but was told she needed to have insurance before being seen. Prior pregnancies were followed by women's health in [place]. LNMP was in the last week of February. Home test came back positive; pregnancy confirmed at this hospital. Smokes half a pack of cigarettes a day. 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 No chronic medical conditions or medications. Denies back pain, vomiting or fever. Patient states she had some spotting and was seen in the ED on June 4. She also was seen for abdominal cramping in April 28.  PCP none OB none  History reviewed. No pertinent past medical history. Past Surgical History  Procedure Laterality Date  . Tonsillectomy    . Cholecystectomy     History reviewed. No pertinent family history. History  Substance Use Topics  . Smoking status: Current Every Day Smoker  . Smokeless tobacco: Not on file  . Alcohol Use: Yes    Smokes one half pack per day Stay at home mom  OB History   Grav Para Term Preterm Abortions TAB SAB Ect Mult Living   3 2             Review of Systems  Constitutional: Negative for fever.  Gastrointestinal: Positive for nausea, abdominal pain and diarrhea. Negative for vomiting.  Genitourinary: Positive for vaginal bleeding.  Musculoskeletal: Negative for back pain.  All other systems reviewed and are negative.  Allergies  Morphine and related and Cefzil  Home Medications   Prior to Admission medications   Medication Sig Start Date End Date Taking? Authorizing Provider  acetaminophen (TYLENOL) 500 MG tablet Take 1,000 mg by mouth daily as needed for mild pain, moderate pain or headache.    Yes Historical Provider, MD  diphenhydrAMINE (SOMINEX) 25 MG tablet Take 25 mg by mouth at bedtime as needed for sleep.   Yes Historical Provider, MD   Triage vitals: Pulse 86  Temp(Src) 98.8 F (37.1 C) (Oral)  Resp 18  SpO2 99%  LMP 09/22/2013  Physical Exam  Nursing note and vitals reviewed. Constitutional: She is oriented to person, place, and time. She appears well-developed and well-nourished.  Non-toxic appearance. She does not appear ill. No distress.  HENT:  Head: Normocephalic and atraumatic.  Right Ear: External ear normal.  Left Ear: External ear normal.  Nose: Nose normal. No mucosal edema or rhinorrhea.  Mouth/Throat: Oropharynx is clear and moist  and mucous membranes are normal. No dental abscesses or uvula swelling.  Eyes: Conjunctivae and EOM are normal. Pupils are equal, round, and reactive to light.  Neck: Normal range of motion and full passive range of motion without pain. Neck supple.  Cardiovascular: Normal rate, regular rhythm and normal heart sounds.  Exam reveals no gallop and no friction rub.   No murmur heard. Pulmonary/Chest: Effort normal and breath sounds normal. No respiratory distress. She has no wheezes. She has no rhonchi. She has no rales. She  exhibits no tenderness and no crepitus.  Abdominal: Soft. Normal appearance and bowel sounds are normal. She exhibits no distension. There is tenderness. There is no rebound and no guarding.  Dull RUQ tenderness  Genitourinary:  Gravid. Normal external genitalia. Moderate thick which cottage cheese type discharge in vault. No blood. Mildly tender diffusely. Cervix is closed and tight.   Musculoskeletal: Normal range of motion. She exhibits no edema and no tenderness.  Moves all extremities well.   Neurological: She is alert and oriented to person, place, and time. She has normal strength. No cranial nerve deficit.  Skin: Skin is warm, dry and intact. No rash noted. No erythema. No pallor.  Psychiatric: She has a normal mood and affect. Her speech is normal and behavior is normal. Her mood appears not anxious.    ED Course  Procedures (including critical care time) Medications - No data to display  DIAGNOSTIC STUDIES: Oxygen Saturation is 99% on room air, normal by my interpretation.    COORDINATION OF CARE: At 7:47 PM: Discussed treatment plan with patient which includes a pelvic exam. Patient agrees.     Bedside ultrasound done. Patient has active fetal movements, fetal heart rate was 147.  Discussed with patient the source of her bleeding is not known. She does not have a UTI, there was no blood seen in her vaginal vault on exam. She did have a lot of white discharge.   Review of prior chart shows patient has not had a formal ultrasound done. She is O+ in blood type.  10:18 PM: Upon recheck, pt is doing well. Discussed lab results. Will be discharged.   Labs Review Results for orders placed during the hospital encounter of 12/21/13  WET PREP, GENITAL      Result Value Ref Range   Yeast Wet Prep HPF POC NONE SEEN  NONE SEEN   Trich, Wet Prep NONE SEEN  NONE SEEN   Clue Cells Wet Prep HPF POC NONE SEEN  NONE SEEN   WBC, Wet Prep HPF POC FEW (*) NONE SEEN  URINALYSIS, ROUTINE W  REFLEX MICROSCOPIC      Result Value Ref Range   Color, Urine YELLOW  YELLOW   APPearance CLEAR  CLEAR   Specific Gravity, Urine 1.020  1.005 - 1.030   pH 6.0  5.0 - 8.0   Glucose, UA NEGATIVE  NEGATIVE mg/dL   Hgb urine dipstick NEGATIVE  NEGATIVE   Bilirubin Urine NEGATIVE  NEGATIVE   Ketones, ur NEGATIVE  NEGATIVE mg/dL   Protein, ur NEGATIVE  NEGATIVE mg/dL   Urobilinogen, UA 0.2  0.0 - 1.0 mg/dL   Nitrite NEGATIVE  NEGATIVE   Leukocytes, UA NEGATIVE  NEGATIVE   No results found.   Imaging Review No results found.   EKG Interpretation None      MDM   Final diagnoses:  Vaginal bleeding before [redacted] weeks gestation  Pregnancy    OTC PNV  Plan discharge  Devoria AlbeIva Knapp, MD, Armando GangFACEP  I personally performed the services described in this documentation, which was scribed in my presence. The recorded information has been reviewed and considered.  Devoria AlbeIva Knapp, MD, FACEP     Ward GivensIva L Knapp, MD 12/22/13 Lyda [id number]

## 2013-12-21 NOTE — ED Notes (Signed)
Patient would like to go home at this time. MD aware.

## 2013-12-21 NOTE — Discharge Instructions (Signed)
Your urine tonight was normal, no blood or urinary tract infection. I did not see any blood on your vaginal exam tonight.  Return if you start having more pain or bleeding like a period. No sex, tampons, douching until you are rechecked by an obstetrician. Call [facility] to start getting your prenatal care. Start taking prenatal vitamins OTC.    Pelvic Rest Pelvic rest is sometimes recommended for women when:   The placenta is partially or completely covering the opening of the cervix (placenta previa).  There is bleeding between the uterine wall and the amniotic sac in the first trimester (subchorionic hemorrhage).  The cervix begins to open without labor starting (incompetent cervix, cervical insufficiency).  The labor is too early (preterm labor). HOME CARE INSTRUCTIONS  Do not have sexual intercourse, stimulation, or an orgasm.  Do not use tampons, douche, or put anything in the vagina.  Do not lift anything over 10 pounds (4.5 kg).  Avoid strenuous activity or straining your pelvic muscles. SEEK MEDICAL CARE IF:  You have any vaginal bleeding during pregnancy. Treat this as a potential emergency.  You have cramping pain felt low in the stomach (stronger than menstrual cramps).  You notice vaginal discharge (watery, mucus, or bloody).  You have a low, dull backache.  There are regular contractions or uterine tightening. SEEK IMMEDIATE MEDICAL CARE IF: You have vaginal bleeding and have placenta previa.  Document Released: 10/19/2010 Document Revised: 09/16/2011 Document Reviewed: 10/19/2010 [facility] Patient Information 2014 [employer] [place].

## 2013-12-23 LAB — GC/CHLAMYDIA PROBE AMP
CT Probe RNA: NEGATIVE
GC PROBE AMP APTIMA: NEGATIVE

## 2014-01-13 ENCOUNTER — Other Ambulatory Visit: Payer: Self-pay | Admitting: Obstetrics & Gynecology

## 2014-01-13 DIAGNOSIS — O0932 Supervision of pregnancy with insufficient antenatal care, second trimester: Secondary | ICD-10-CM

## 2014-01-17 ENCOUNTER — Ambulatory Visit (INDEPENDENT_AMBULATORY_CARE_PROVIDER_SITE_OTHER): Payer: Medicaid Other

## 2014-01-17 ENCOUNTER — Other Ambulatory Visit: Payer: Self-pay | Admitting: Obstetrics & Gynecology

## 2014-01-17 ENCOUNTER — Encounter: Payer: Self-pay | Admitting: Obstetrics & Gynecology

## 2014-01-17 DIAGNOSIS — Z1389 Encounter for screening for other disorder: Secondary | ICD-10-CM

## 2014-01-17 DIAGNOSIS — O0930 Supervision of pregnancy with insufficient antenatal care, unspecified trimester: Secondary | ICD-10-CM

## 2014-01-17 DIAGNOSIS — O0932 Supervision of pregnancy with insufficient antenatal care, second trimester: Secondary | ICD-10-CM

## 2014-01-17 DIAGNOSIS — Z363 Encounter for antenatal screening for malformations: Secondary | ICD-10-CM

## 2014-01-17 NOTE — Progress Notes (Signed)
U/S(19+3wks)-active fetus, meas c/w ED dates, FHR- 134bpm, fluid wnl, anterior Gr 0 placenta, cx appears closed (4+cm), bilateral adnexa appears WNL, female fetus, no obvious abnl noted although sub-optimal views due to fetal position and maternal body habitus

## 2014-01-19 ENCOUNTER — Encounter: Payer: Medicaid Other | Admitting: Women's Health

## 2014-01-31 ENCOUNTER — Encounter: Payer: Self-pay | Admitting: Women's Health

## 2014-01-31 ENCOUNTER — Ambulatory Visit (INDEPENDENT_AMBULATORY_CARE_PROVIDER_SITE_OTHER): Payer: Medicaid Other | Admitting: Women's Health

## 2014-01-31 VITALS — BP 110/60 | Wt 176.0 lb

## 2014-01-31 DIAGNOSIS — F32A Depression, unspecified: Secondary | ICD-10-CM | POA: Insufficient documentation

## 2014-01-31 DIAGNOSIS — Z3480 Encounter for supervision of other normal pregnancy, unspecified trimester: Secondary | ICD-10-CM | POA: Insufficient documentation

## 2014-01-31 DIAGNOSIS — Z3482 Encounter for supervision of other normal pregnancy, second trimester: Secondary | ICD-10-CM

## 2014-01-31 DIAGNOSIS — F411 Generalized anxiety disorder: Secondary | ICD-10-CM | POA: Insufficient documentation

## 2014-01-31 DIAGNOSIS — O0930 Supervision of pregnancy with insufficient antenatal care, unspecified trimester: Secondary | ICD-10-CM

## 2014-01-31 DIAGNOSIS — F418 Other specified anxiety disorders: Secondary | ICD-10-CM | POA: Insufficient documentation

## 2014-01-31 DIAGNOSIS — F17200 Nicotine dependence, unspecified, uncomplicated: Secondary | ICD-10-CM | POA: Insufficient documentation

## 2014-01-31 DIAGNOSIS — O99330 Smoking (tobacco) complicating pregnancy, unspecified trimester: Secondary | ICD-10-CM

## 2014-01-31 DIAGNOSIS — Z331 Pregnant state, incidental: Secondary | ICD-10-CM

## 2014-01-31 DIAGNOSIS — F329 Major depressive disorder, single episode, unspecified: Secondary | ICD-10-CM | POA: Insufficient documentation

## 2014-01-31 DIAGNOSIS — Z1389 Encounter for screening for other disorder: Secondary | ICD-10-CM

## 2014-01-31 LAB — CBC
HEMATOCRIT: 35.7 % — AB (ref 36.0–46.0)
Hemoglobin: 12.3 g/dL (ref 12.0–15.0)
MCH: 31.7 pg (ref 26.0–34.0)
MCHC: 34.5 g/dL (ref 30.0–36.0)
MCV: 92 fL (ref 78.0–100.0)
Platelets: 220 10*3/uL (ref 150–400)
RBC: 3.88 MIL/uL (ref 3.87–5.11)
RDW: 15.2 % (ref 11.5–15.5)
WBC: 14.4 10*3/uL — AB (ref 4.0–10.5)

## 2014-01-31 LAB — POCT URINALYSIS DIPSTICK
GLUCOSE UA: NEGATIVE
Ketones, UA: NEGATIVE
Nitrite, UA: NEGATIVE
RBC UA: NEGATIVE

## 2014-01-31 MED ORDER — ESCITALOPRAM OXALATE 10 MG PO TABS: 10.0000 mg | ORAL_TABLET | Freq: Every day | ORAL | Status: DC

## 2014-01-31 NOTE — Progress Notes (Signed)
Subjective:  Linda Jackson is a 23 y.o. 233P2002 Caucasian female at 6569w3d by 19wk u/s, being seen today for her first obstetrical visit.  Her obstetrical history is significant for smoker, obesity, h/o partial abruption last pregnancy, 2 term SVDs. Has daughters that are 1 & 24yo.  [place], switched here for this pregnancy b/c she was unsatisfied w/ care in last pregnancy. Was smoking 1ppd, now down to 1/2ppd. Pregnancy history fully reviewed.  Patient reports h/o depression, hasn't been on meds since right after birth of last baby 1 yr ago- cries all the time, angry a lot, not eating well. Denies SI/HI/II. Does not currently have mental health provider. Requests restarting meds. Denies vb, cramping, uti s/s, abnormal/malodorous vag d/c, or vulvovaginal itching/irritation.  BP 110/60  Wt 176 lb (79.833 kg)  LMP 09/22/2013  HISTORY: OB History  Gravida Para Term Preterm AB SAB TAB Ectopic Multiple Living  3 2 2       2     # Outcome Date GA Lbr Len/2nd Weight Sex Delivery Anes PTL Lv  3 CUR           2 TRM 09/28/12 5313w0d  8 lb 7 oz (3.827 kg) F SVD EPI N Y  1 TRM 11/21/11 5250w6d  8 lb 3 oz (3.714 kg) F SVD EPI N Y     Past Medical History  Diagnosis Date  . Depression   . Bipolar 1 disorder   . Chronic back pain    Past Surgical History  Procedure Laterality Date  . Tonsillectomy    . Cholecystectomy     Family History  Problem Relation Age of Onset  . Depression Mother   . Kidney disease Mother   . Stroke Mother   . ADD / ADHD Brother   . COPD Maternal Grandmother   . Cancer Maternal Grandfather     brain  . Diabetes Paternal Grandfather   . Hypertension Paternal Grandfather     Exam   System:     General: Well developed & nourished, no acute distress   Skin: Warm & dry, normal coloration and turgor, no rashes   Neurologic: Alert & oriented, normal mood   Cardiovascular: Regular rate & rhythm   Respiratory: Effort & rate normal, LCTAB,  acyanotic   Abdomen: Soft, non tender   Extremities: normal strength, tone   Pelvic Exam:    Perineum: Normal perineum   Vulva: Normal, no lesions   Vagina:  Normal mucosa, normal discharge   Cervix: Normal, bulbous, appears closed   Uterus: Normal size/shape/contour for GA   [facility] pt FHR: 140 via doppler   Assessment:   Pregnancy: [id number] Patient Active Problem List   Diagnosis Date Noted  . Supervision of other normal pregnancy 01/31/2014    Priority: High  . Late prenatal care affecting pregnancy 01/31/2014    Priority: High    7569w3d G3P2002 New OB visit Depression Smoker Obesity Late pnc   Plan:  Initial labs drawn Continue prenatal vitamins Problem list reviewed and updated Reviewed warning s/s to report Reviewed recommended weight gain based on pre-gravid BMI Encouraged well-balanced diet Genetic Screening discussed Quad Screen: declined Cystic fibrosis screening discussed declined Ultrasound discussed; fetal survey: results reviewed Follow up in 3 weeks for visit Smokes 1/2ppd, advised cessation, discussed risks to fetus while pregnant, to infant pp, and to herself. Offered QuitlineNC, declined    [facility] completed Rx Lexapro 10mg  daily, [facility] Repeat anatomy u/s @ 28wks d/t suboptimal views Get pap results from [facility]  Marge Duncans CNM, [facility] 01/31/2014 10:20 AM

## 2014-01-31 NOTE — Patient Instructions (Signed)
Second Trimester of Pregnancy The second trimester is from week 13 through week 28, months 4 through 6. The second trimester is often a time when you feel your best. Your body has also adjusted to being pregnant, and you begin to feel better physically. Usually, morning sickness has lessened or quit completely, you may have more energy, and you may have an increase in appetite. The second trimester is also a time when the fetus is growing rapidly. At the end of the sixth month, the fetus is about 9 inches long and weighs about 1 pounds. You will likely begin to feel the baby move (quickening) between 18 and 20 weeks of the pregnancy. BODY CHANGES Your body goes through many changes during pregnancy. The changes vary from woman to woman.   Your weight will continue to increase. You will notice your lower abdomen bulging out.  You may begin to get stretch marks on your hips, abdomen, and breasts.  You may develop headaches that can be relieved by medicines approved by your health care provider.  You may urinate more often because the fetus is pressing on your bladder.  You may develop or continue to have heartburn as a result of your pregnancy.  You may develop constipation because certain hormones are causing the muscles that push waste through your intestines to slow down.  You may develop hemorrhoids or swollen, bulging veins (varicose veins).  You may have back pain because of the weight gain and pregnancy hormones relaxing your joints between the bones in your pelvis and as a result of a shift in weight and the muscles that support your balance.  Your breasts will continue to grow and be tender.  Your gums may bleed and may be sensitive to brushing and flossing.  Dark spots or blotches (chloasma, mask of pregnancy) may develop on your face. This will likely fade after the baby is born.  A dark line from your belly button to the pubic area (linea nigra) may appear. This will likely fade  after the baby is born.  You may have changes in your hair. These can include thickening of your hair, rapid growth, and changes in texture. Some women also have hair loss during or after pregnancy, or hair that feels dry or thin. Your hair will most likely return to normal after your baby is born. WHAT TO EXPECT AT YOUR PRENATAL VISITS During a routine prenatal visit:  You will be weighed to make sure you and the fetus are growing normally.  Your blood pressure will be taken.  Your abdomen will be measured to track your baby's growth.  The fetal heartbeat will be listened to.  Any test results from the previous visit will be discussed. Your health care provider may ask you:  How you are feeling.  If you are feeling the baby move.  If you have had any abnormal symptoms, such as leaking fluid, bleeding, severe headaches, or abdominal cramping.  If you have any questions. Other tests that may be performed during your second trimester include:  Blood tests that check for:  Low iron levels (anemia).  Gestational diabetes (between 24 and 28 weeks).  Rh antibodies.  Urine tests to check for infections, diabetes, or protein in the urine.  An ultrasound to confirm the proper growth and development of the baby.  An amniocentesis to check for possible genetic problems.  Fetal screens for spina bifida and Down syndrome. HOME CARE INSTRUCTIONS   Avoid all smoking, herbs, alcohol, and unprescribed   drugs. These chemicals affect the formation and growth of the baby.  Follow your health care provider's instructions regarding medicine use. There are medicines that are either safe or unsafe to take during pregnancy.  Exercise only as directed by your health care provider. Experiencing uterine cramps is a good sign to stop exercising.  Continue to eat regular, healthy meals.  Wear a good support bra for breast tenderness.  Do not use hot tubs, steam rooms, or saunas.  Wear your  seat belt at all times when driving.  Avoid raw meat, uncooked cheese, cat litter boxes, and soil used by cats. These carry germs that can cause birth defects in the baby.  Take your prenatal vitamins.  Try taking a stool softener (if your health care provider approves) if you develop constipation. Eat more high-fiber foods, such as fresh vegetables or fruit and whole grains. Drink plenty of fluids to keep your urine clear or pale yellow.  Take warm sitz baths to soothe any pain or discomfort caused by hemorrhoids. Use hemorrhoid cream if your health care provider approves.  If you develop varicose veins, wear support hose. Elevate your feet for 15 minutes, 3-4 times a day. Limit salt in your diet.  Avoid heavy lifting, wear low heel shoes, and practice good posture.  Rest with your legs elevated if you have leg cramps or low back pain.  Visit your dentist if you have not gone yet during your pregnancy. Use a soft toothbrush to brush your teeth and be gentle when you floss.  A sexual relationship may be continued unless your health care provider directs you otherwise.  Continue to go to all your prenatal visits as directed by your health care provider. SEEK MEDICAL CARE IF:   You have dizziness.  You have mild pelvic cramps, pelvic pressure, or nagging pain in the abdominal area.  You have persistent nausea, vomiting, or diarrhea.  You have a bad smelling vaginal discharge.  You have pain with urination. SEEK IMMEDIATE MEDICAL CARE IF:   You have a fever.  You are leaking fluid from your vagina.  You have spotting or bleeding from your vagina.  You have severe abdominal cramping or pain.  You have rapid weight gain or loss.  You have shortness of breath with chest pain.  You notice sudden or extreme swelling of your face, hands, ankles, feet, or legs.  You have not felt your baby move in over an hour.  You have severe headaches that do not go away with  medicine.  You have vision changes. Document Released: 06/18/2001 Document Revised: 06/29/2013 Document Reviewed: 08/25/2012 ExitCare Patient Information 2015 [employer]. This information is not intended to replace advice given to you by your health care provider. Make sure you discuss any questions you have with your health care provider.  Depression Depression refers to feeling sad, low, down in the dumps, blue, gloomy, or empty. In general, there are two kinds of depression: 1. Normal sadness or normal grief. This kind of depression is one that we all feel from time to time after upsetting life experiences, such as the loss of a job or the ending of a relationship. This kind of depression is considered normal, is short lived, and resolves within a few days to 2 weeks. Depression experienced after the loss of a loved one (bereavement) often lasts longer than 2 weeks but normally gets better with time. 2. Clinical depression. This kind of depression lasts longer than normal sadness or normal grief or   interferes with your ability to function at home, at work, and in school. It also interferes with your personal relationships. It affects almost every aspect of your life. Clinical depression is an illness. Symptoms of depression can also be caused by conditions other than those mentioned above, such as:  Physical illness. Some physical illnesses, including underactive thyroid gland (hypothyroidism), severe anemia, specific types of cancer, diabetes, uncontrolled seizures, heart and lung problems, strokes, and chronic pain are commonly associated with symptoms of depression.  Side effects of some prescription medicine. In some people, certain types of medicine can cause symptoms of depression.  Substance abuse. Abuse of alcohol and illicit drugs can cause symptoms of depression. SYMPTOMS Symptoms of normal sadness and normal grief include the following:  Feeling sad or crying for short periods  of time.  Not caring about anything (apathy).  Difficulty sleeping or sleeping too much.  No longer able to enjoy the things you used to enjoy.  Desire to be by oneself all the time (social isolation).  Lack of energy or motivation.  Difficulty concentrating or remembering.  Change in appetite or weight.  Restlessness or agitation. Symptoms of clinical depression include the same symptoms of normal sadness or normal grief and also the following symptoms:  Feeling sad or crying all the time.  Feelings of guilt or worthlessness.  Feelings of hopelessness or helplessness.  Thoughts of suicide or the desire to harm yourself (suicidal ideation).  Loss of touch with reality (psychotic symptoms). Seeing or hearing things that are not real (hallucinations) or having false beliefs about your life or the people around you (delusions and paranoia). DIAGNOSIS  The diagnosis of clinical depression is usually based on how bad the symptoms are and how long they have lasted. Your health care provider will also ask you questions about your medical history and substance use to find out if physical illness, use of prescription medicine, or substance abuse is causing your depression. Your health care provider may also order blood tests. TREATMENT  Often, normal sadness and normal grief do not require treatment. However, sometimes antidepressant medicine is given for bereavement to ease the depressive symptoms until they resolve. The treatment for clinical depression depends on how bad the symptoms are but often includes antidepressant medicine, counseling with a mental health professional, or both. Your health care provider will help to determine what treatment is best for you. Depression caused by physical illness usually goes away with appropriate medical treatment of the illness. If prescription medicine is causing depression, talk with your health care provider about stopping the medicine,  decreasing the dose, or changing to another medicine. Depression caused by the abuse of alcohol or illicit drugs goes away when you stop using these substances. Some adults need professional help in order to stop drinking or using drugs. SEEK IMMEDIATE MEDICAL CARE IF:  You have thoughts about hurting yourself or others.  You lose touch with reality (have psychotic symptoms).  You are taking medicine for depression and have a serious side effect. FOR MORE INFORMATION  National Alliance on Mental Illness: www.nami.[employer]  National Institute of Mental Health: http://www.maynard.net/www.nimh.nih.gov Document Released: 06/21/2000 Document Revised: 11/08/2013 Document Reviewed: 09/23/2011 [facility] Patient Information 2015 [place]Care, [place]. This information is not intended to replace advice given to you by your health care provider. Make sure you discuss any questions you have with your health care provider.

## 2014-02-01 ENCOUNTER — Encounter: Payer: Self-pay | Admitting: Women's Health

## 2014-02-01 LAB — DRUG SCREEN, URINE, NO CONFIRMATION
AMPHETAMINE SCRN UR: NEGATIVE
Barbiturate Quant, Ur: NEGATIVE
Benzodiazepines.: NEGATIVE
COCAINE METABOLITES: NEGATIVE
CREATININE, U: 127.1 mg/dL
Marijuana Metabolite: NEGATIVE
Methadone: NEGATIVE
Opiate Screen, Urine: NEGATIVE
PHENCYCLIDINE (PCP): NEGATIVE
Propoxyphene: NEGATIVE

## 2014-02-01 LAB — URINALYSIS, ROUTINE W REFLEX MICROSCOPIC
Bilirubin Urine: NEGATIVE
Glucose, UA: NEGATIVE mg/dL
Hgb urine dipstick: NEGATIVE
Ketones, ur: NEGATIVE mg/dL
NITRITE: NEGATIVE
Protein, ur: NEGATIVE mg/dL
Specific Gravity, Urine: 1.015 (ref 1.005–1.030)
UROBILINOGEN UA: 0.2 mg/dL (ref 0.0–1.0)
pH: 6.5 (ref 5.0–8.0)

## 2014-02-01 LAB — URINALYSIS, MICROSCOPIC ONLY
Casts: NONE SEEN
Crystals: NONE SEEN

## 2014-02-01 LAB — HIV ANTIBODY (ROUTINE TESTING W REFLEX): HIV 1&2 Ab, 4th Generation: NONREACTIVE

## 2014-02-01 LAB — RUBELLA SCREEN: RUBELLA: 1.19 {index} — AB (ref <0.90)

## 2014-02-01 LAB — VARICELLA ZOSTER ANTIBODY, IGG: Varicella IgG: 285.9 Index — ABNORMAL HIGH (ref <135.00)

## 2014-02-01 LAB — URINE CULTURE

## 2014-02-01 LAB — ANTIBODY SCREEN: Antibody Screen: NEGATIVE

## 2014-02-01 LAB — HEPATITIS B SURFACE ANTIGEN: Hepatitis B Surface Ag: NEGATIVE

## 2014-02-01 LAB — OXYCODONE SCREEN, UA, RFLX CONFIRM: Oxycodone Screen, Ur: NEGATIVE ng/mL

## 2014-02-01 LAB — RPR

## 2014-02-15 ENCOUNTER — Encounter: Payer: Self-pay | Admitting: *Deleted

## 2014-02-21 ENCOUNTER — Encounter: Payer: Medicaid Other | Admitting: Women's Health

## 2014-03-01 ENCOUNTER — Encounter: Payer: Self-pay | Admitting: Advanced Practice Midwife

## 2014-03-01 ENCOUNTER — Ambulatory Visit (INDEPENDENT_AMBULATORY_CARE_PROVIDER_SITE_OTHER): Payer: Medicaid Other | Admitting: Advanced Practice Midwife

## 2014-03-01 ENCOUNTER — Telehealth: Payer: Self-pay | Admitting: *Deleted

## 2014-03-01 VITALS — BP 118/64 | Wt 179.0 lb

## 2014-03-01 DIAGNOSIS — Z3482 Encounter for supervision of other normal pregnancy, second trimester: Secondary | ICD-10-CM

## 2014-03-01 DIAGNOSIS — B081 Molluscum contagiosum: Secondary | ICD-10-CM

## 2014-03-01 DIAGNOSIS — Z3480 Encounter for supervision of other normal pregnancy, unspecified trimester: Secondary | ICD-10-CM

## 2014-03-01 DIAGNOSIS — Z1389 Encounter for screening for other disorder: Secondary | ICD-10-CM

## 2014-03-01 DIAGNOSIS — Z331 Pregnant state, incidental: Secondary | ICD-10-CM

## 2014-03-01 LAB — POCT URINALYSIS DIPSTICK
Blood, UA: NEGATIVE
Glucose, UA: NEGATIVE
Ketones, UA: NEGATIVE
NITRITE UA: NEGATIVE
PROTEIN UA: NEGATIVE

## 2014-03-01 MED ORDER — FLUCONAZOLE 150 MG PO TABS: ORAL_TABLET | ORAL | Status: DC

## 2014-03-01 MED ORDER — IMIQUIMOD 5 % EX CREA: TOPICAL_CREAM | CUTANEOUS | Status: DC

## 2014-03-01 MED ORDER — SERTRALINE HCL 50 MG PO TABS: 50.0000 mg | ORAL_TABLET | Freq: Every day | ORAL | Status: DC

## 2014-03-01 NOTE — Progress Notes (Signed)
[id number] [redacted]w[redacted]d Estimated Date of Delivery: 06/10/14  Blood pressure 118/64, weight 179 lb (81.194 kg), last menstrual period 09/22/2013.   BP weight and urine results all reviewed and noted.  Please refer to the obstetrical flow sheet for the fundal height and fetal heart rate documentation:  Patient reports good fetal movement, denies any bleeding and no rupture of membranes symptoms or regular contractions. Patient c/o vaginal itching and burning, rash on buttocks. Rash "comes and goes".  FOB has same bumps on legs.  Looks like moluscum contagiosum SSE:  Heavy d/c c/w yeast.  Says that Lexapro caused nausea. Will try zoloft All questions were answered.  Plan:  Continued routine obstetrical care, aldara 3x/week; diflucan for yeast; zoloft  qd  Follow up in 2 weeks for OB appointment, PN2 and recheck anatomy

## 2014-03-01 NOTE — Patient Instructions (Signed)
1. Before your test, do not eat or drink anything for 8-10 hours prior to your  appointment (a small amount of water is allowed and you may take any medicines you normally take). Be sure to drink lots of water the day before. 2. When you arrive, your blood will be drawn for a 'fasting' blood sugar level.  Then you will be given a sweetened carbonated beverage to drink. You should  complete drinking this beverage within five minutes. After finishing the  beverage, you will have your blood drawn exactly 1 and 2 hours later. Having  your blood drawn on time is an important part of this test. A total of three blood  samples will be done. 3. The test takes approximately 2  hours. During the test, do not have anything to  eat or drink. Do not smoke, chew gum (not even sugarless gum) or use breath mints.  4. During the test you should remain close by and seated as much as possible and  avoid walking around. You may want to bring a book or something else to  occupy your time.  5. After your test, you may eat and drink as normal. You may want to bring a snack  to eat after the test is finished. Your provider will advise you as to the results of  this test and any follow-up if necessary  You will also be retested for syphilis, HIV and blood levels (anemia):  You were already tested in the first trimester, but [place] recommends retesting.  Additionally, you will be tested for Type 2 Herpes. MOST people do not know that they have genital herpes, as only around 15% of people have outbreaks.  However, it is still transmittable to other people, including the baby (but only during the birth).  If you test positive for Type 2 Herpes, we place you on a medicine called acyclovir the last 6 weeks of your pregnancy to prevent transmission of the virus to the baby during the birth.    If your sugar test is positive for gestational diabetes, you will be given an phone call and further instructions discussed.   We typically do not call patients with positive herpes results, but will discuss it at your next appointment.  If you wish to know all of your test results before your next appointment, feel free to call the office, or look up your test results on Mychart.  (The range that the lab uses for normal values of the sugar test are not necessarily the range that is used for pregnant women; if your results are within the range, they are definitely normal.  However, if a value is deemed "high" by the lab, it may not be too high for a pregnant woman.  We will need to discuss the normal range if your value(s) fall in the "high" category).     Sometime between 27 and 36 weeks, it is recommended that you and anyone who is going to be in close contact with your baby receive the Tdap booster.  You should receive it EACH pregnancy, regardless of when your last booster was.  You may go to the Health Department (no appointment necessary) or your Primary Care office to receive the vaccine.  If you do not receive the vaccine prior to delivery, it will be offered in the hospital.  However, if you get it at least 2 weeks prior to delivery, you will have the added advantage of passing the immunity to your baby.   

## 2014-03-01 NOTE — Telephone Encounter (Signed)
Pts pharmacy sent a prior authorization request for the Imiquimod 5% cream (Aldara), when I called for the prior auth they informed me that if the pharmacy will send it through as name brand not generic they will pay and no prior auth will be needed.  Informed [facility] pharmacy and they stated that they would do that.

## 2014-03-15 ENCOUNTER — Encounter: Payer: Medicaid Other | Admitting: Women's Health

## 2014-03-15 ENCOUNTER — Other Ambulatory Visit: Payer: Medicaid Other

## 2014-05-09 ENCOUNTER — Encounter: Payer: Self-pay | Admitting: Advanced Practice Midwife

## 2014-05-18 ENCOUNTER — Telehealth: Payer: Self-pay | Admitting: Advanced Practice Midwife

## 2014-05-18 NOTE — Telephone Encounter (Signed)
Pt states she has a cough and congestion, has been taking Tylenol and Benadryl with no relief.  Pt reports good FM, no vaginal bleeding and no sudden gush of fluids.  Advised pt to use Robitussin Mucus and Chest congestion, saline nasal spray and cough drops.  Advised she can also take Mucinex or Sudafed, push fluids and use Tylenol when needed for the pain and call if no improvement or symptoms worsen.  Pt verbalized understanding.

## 2014-05-26 ENCOUNTER — Encounter: Payer: Medicaid Other | Admitting: Advanced Practice Midwife

## 2014-05-26 ENCOUNTER — Encounter: Payer: Self-pay | Admitting: Advanced Practice Midwife

## 2014-06-01 ENCOUNTER — Ambulatory Visit (INDEPENDENT_AMBULATORY_CARE_PROVIDER_SITE_OTHER): Payer: Medicaid Other | Admitting: Women's Health

## 2014-06-01 ENCOUNTER — Encounter: Payer: Self-pay | Admitting: Women's Health

## 2014-06-01 VITALS — BP 112/80 | Wt 187.0 lb

## 2014-06-01 DIAGNOSIS — O0933 Supervision of pregnancy with insufficient antenatal care, third trimester: Secondary | ICD-10-CM

## 2014-06-01 DIAGNOSIS — Z1389 Encounter for screening for other disorder: Secondary | ICD-10-CM

## 2014-06-01 DIAGNOSIS — Z331 Pregnant state, incidental: Secondary | ICD-10-CM

## 2014-06-01 DIAGNOSIS — Z3685 Encounter for antenatal screening for Streptococcus B: Secondary | ICD-10-CM

## 2014-06-01 DIAGNOSIS — Z23 Encounter for immunization: Secondary | ICD-10-CM

## 2014-06-01 DIAGNOSIS — Z1159 Encounter for screening for other viral diseases: Secondary | ICD-10-CM

## 2014-06-01 DIAGNOSIS — Z3483 Encounter for supervision of other normal pregnancy, third trimester: Secondary | ICD-10-CM

## 2014-06-01 DIAGNOSIS — Z118 Encounter for screening for other infectious and parasitic diseases: Secondary | ICD-10-CM

## 2014-06-01 DIAGNOSIS — O0930 Supervision of pregnancy with insufficient antenatal care, unspecified trimester: Secondary | ICD-10-CM | POA: Insufficient documentation

## 2014-06-01 LAB — POCT URINALYSIS DIPSTICK
Blood, UA: NEGATIVE
Glucose, UA: NEGATIVE
Ketones, UA: NEGATIVE
Nitrite, UA: NEGATIVE
PROTEIN UA: NEGATIVE

## 2014-06-01 LAB — OB RESULTS CONSOLE GC/CHLAMYDIA
Chlamydia: NEGATIVE
Gonorrhea: NEGATIVE

## 2014-06-01 MED ORDER — HYDROCORTISONE 2.5 % RE CREA: 1.0000 "application " | TOPICAL_CREAM | Freq: Two times a day (BID) | RECTAL | Status: DC | PRN

## 2014-06-01 NOTE — Patient Instructions (Signed)
You will have your sugar test next visit.  Please do not eat or drink anything after midnight the night before you come, not even water.  You will be here for at least two hours.     Call the office [phone]) or [facility] if:  You begin to have strong, frequent contractions  Your water breaks.  Sometimes it is a big gush of fluid, sometimes it is just a trickle that keeps getting your panties wet or running down your legs  You have vaginal bleeding.  It is normal to have a small amount of spotting if your cervix was checked.   You don't feel your baby moving like normal.  If you don't, get you something to eat and drink and lay down and focus on feeling your baby move.  You should feel at least 10 movements in 2 hours.  If you don't, you should call the office or [facility].    Tdap Vaccine  It is recommended that you get the Tdap vaccine during the third trimester of EACH pregnancy to help protect your baby from getting pertussis (whooping cough)  27-36 weeks is the BEST time to do this so that you can pass the protection on to your baby. During pregnancy is better than after pregnancy, but if you are unable to get it during pregnancy it will be offered at the hospital.   You can get this vaccine at the health department or your family doctor  Everyone who will be around your baby should also be up-to-date on their vaccines. Adults (who are not pregnant) only need 1 dose of Tdap during adulthood.    Braxton Hicks Contractions Contractions of the uterus can occur throughout pregnancy. Contractions are not always a sign that you are in labor.  WHAT ARE BRAXTON HICKS CONTRACTIONS?  Contractions that occur before labor are called Braxton Hicks contractions, or false labor. Toward the end of pregnancy (32-34 weeks), these contractions can develop more often and may become more forceful. This is not true labor because these contractions do not result in opening  (dilatation) and thinning of the cervix. They are sometimes difficult to tell apart from true labor because these contractions can be forceful and people have different pain tolerances. You should not feel embarrassed if you go to the hospital with false labor. Sometimes, the only way to tell if you are in true labor is for your health care provider to look for changes in the cervix. If there are no prenatal problems or other health problems associated with the pregnancy, it is completely safe to be sent home with false labor and await the onset of true labor. HOW CAN YOU TELL THE DIFFERENCE BETWEEN TRUE AND FALSE LABOR? False Labor  The contractions of false labor are usually shorter and not as hard as those of true labor.   The contractions are usually irregular.   The contractions are often felt in the front of the lower abdomen and in the groin.   The contractions may go away when you walk around or change positions while lying down.   The contractions get weaker and are shorter lasting as time goes on.   The contractions do not usually become progressively stronger, regular, and closer together as with true labor.  True Labor  Contractions in true labor last 30-70 seconds, become very regular, usually become more intense, and increase in frequency.   The contractions do not go away with walking.  The discomfort is usually felt in the top of the uterus and spreads to the lower abdomen and low back.   True labor can be determined by your health care provider with an exam. This will show that the cervix is dilating and getting thinner.  WHAT TO REMEMBER  Keep up with your usual exercises and follow other instructions given by your health care provider.   Take medicines as directed by your health care provider.   Keep your regular prenatal appointments.   Eat and drink lightly if you think you are going into labor.   If Braxton Hicks contractions are making you  uncomfortable:   Change your position from lying down or resting to walking, or from walking to resting.   Sit and rest in a tub of warm water.   Drink 2-3 glasses of water. Dehydration may cause these contractions.   Do slow and deep breathing several times an hour.  WHEN SHOULD I SEEK IMMEDIATE MEDICAL CARE? Seek immediate medical care if:  Your contractions become stronger, more regular, and closer together.   You have fluid leaking or gushing from your vagina.   You have a fever.   You pass blood-tinged mucus.   You have vaginal bleeding.   You have continuous abdominal pain.   You have low back pain that you never had before.   You feel your baby's head pushing down and causing pelvic pressure.   Your baby is not moving as much as it used to.  Document Released: 06/24/2005 Document Revised: 06/29/2013 Document Reviewed: 04/05/2013 [facility] Patient Information 2015 [place], [place]. This information is not intended to replace advice given to you by your health care provider. Make sure you discuss any questions you have with your health care provider.

## 2014-06-01 NOTE — Addendum Note (Signed)
Addended by: Cheral MarkerBOOKER, KIMBERLY R on: 06/01/2014 03:46 PM   Modules accepted: Level of Service

## 2014-06-01 NOTE — Progress Notes (Addendum)
Low-risk OB appointment [id number] [redacted]w[redacted]d Estimated Date of Delivery: 06/10/14 BP 112/80 mmHg  Wt 187 lb (84.823 kg)  LMP 09/22/2013  BP, weight, and urine reviewed.  Refer to obstetrical flow sheet for FH & FHR.  Reports good fm.  Denies regular uc's, lof, vb, or uti s/s. Lt side/rib pain, constant sharp x 1 week. No CVAT, bm's normal, sounds like costochondritis- tender to touch. Try ice/heat/apap prn. Burning hemorrhoids, witch hazel pads help some- rx anusol hc.  No care since 25wks- states she's had transportation issues. Never got pn2 or repeat anatomy u/s.  GBS collected SVE per request: 3/50/-2, vtx Reviewed labor s/s, fkc. Plan:  Continue routine obstetrical care  F/U in 1wk for OB appointment, pn2

## 2014-06-01 NOTE — Addendum Note (Signed)
Addended by: Gaylyn RongEVANS, Cinda Hara A on: 06/01/2014 04:07 PM   Modules accepted: Orders

## 2014-06-01 NOTE — Addendum Note (Signed)
Addended by: Shawna ClampBOOKER, Halim Surrette R on: 06/01/2014 04:10 PM   Modules accepted: Orders

## 2014-06-02 LAB — GC/CHLAMYDIA PROBE AMP
CT Probe RNA: NEGATIVE
GC Probe RNA: NEGATIVE

## 2014-06-03 LAB — STREP B DNA PROBE: GBSP: NOT DETECTED

## 2014-06-06 ENCOUNTER — Encounter (HOSPITAL_COMMUNITY): Payer: Self-pay | Admitting: *Deleted

## 2014-06-06 ENCOUNTER — Inpatient Hospital Stay (HOSPITAL_COMMUNITY)
Admission: AD | Admit: 2014-06-06 | Discharge: 2014-06-06 | Disposition: A | Discharge status: Home / Self Care | Payer: Medicaid Other | Source: Ambulatory Visit | Attending: Obstetrics & Gynecology | Admitting: Obstetrics & Gynecology

## 2014-06-06 DIAGNOSIS — Z3A39 39 weeks gestation of pregnancy: Secondary | ICD-10-CM | POA: Insufficient documentation

## 2014-06-06 DIAGNOSIS — O471 False labor at or after 37 completed weeks of gestation: Secondary | ICD-10-CM | POA: Insufficient documentation

## 2014-06-06 NOTE — Discharge Instructions (Signed)

## 2014-06-06 NOTE — MAU Note (Signed)
Patient states she is having contractions every 10-15 minutes and feeling a lot of lower abdominal pressure. Denies bleeding or leaking and reports good fetal movement.

## 2014-06-07 ENCOUNTER — Encounter: Payer: Medicaid Other | Admitting: Obstetrics & Gynecology

## 2014-06-07 ENCOUNTER — Other Ambulatory Visit: Payer: Medicaid Other

## 2014-06-09 ENCOUNTER — Ambulatory Visit (INDEPENDENT_AMBULATORY_CARE_PROVIDER_SITE_OTHER): Payer: Medicaid Other | Admitting: Advanced Practice Midwife

## 2014-06-09 VITALS — BP 118/84 | Wt 186.5 lb

## 2014-06-09 DIAGNOSIS — Z1389 Encounter for screening for other disorder: Secondary | ICD-10-CM

## 2014-06-09 DIAGNOSIS — Z131 Encounter for screening for diabetes mellitus: Secondary | ICD-10-CM

## 2014-06-09 DIAGNOSIS — Z331 Pregnant state, incidental: Secondary | ICD-10-CM

## 2014-06-09 DIAGNOSIS — Z3483 Encounter for supervision of other normal pregnancy, third trimester: Secondary | ICD-10-CM

## 2014-06-09 LAB — POCT URINALYSIS DIPSTICK
Glucose, UA: NEGATIVE
Ketones, UA: NEGATIVE
Leukocytes, UA: NEGATIVE
Nitrite, UA: NEGATIVE
PROTEIN UA: NEGATIVE
RBC UA: NEGATIVE

## 2014-06-09 LAB — GLUCOSE, POCT (MANUAL RESULT ENTRY): POC Glucose: 73 mg/dl (ref 70–99)

## 2014-06-09 NOTE — Progress Notes (Signed)
[id number] Estimated Date of Delivery: 06/10/14  Blood pressure 118/84, weight 186 lb 8 oz (84.596 kg), last menstrual period 09/22/2013.   BP weight and urine results all reviewed and noted. Pt never came for her PN2.  Random CBG today 72.  Please refer to the obstetrical flow sheet for the fundal height and fetal heart rate documentation:  Patient reports good fetal movement, denies any bleeding and no rupture of membranes symptoms or regular contractions. Patient is without complaints. All questions were answered.  Plan:  Continued routine obstetrical care,   Follow up in 1 weeks for OB appointment, NST

## 2014-06-09 NOTE — Addendum Note (Signed)
Addended by: Criss AlvinePULLIAM, CHRYSTAL G on: 06/09/2014 10:23 AM   Modules accepted: Orders

## 2014-06-11 ENCOUNTER — Inpatient Hospital Stay (HOSPITAL_COMMUNITY): Payer: Medicaid Other | Admitting: Anesthesiology

## 2014-06-11 ENCOUNTER — Encounter (HOSPITAL_COMMUNITY): Payer: Self-pay | Admitting: *Deleted

## 2014-06-11 ENCOUNTER — Inpatient Hospital Stay (HOSPITAL_COMMUNITY)
Admission: AD | Admit: 2014-06-11 | Discharge: 2014-06-12 | DRG: 775 | Disposition: A | Discharge status: Home / Self Care | Payer: Medicaid Other | Source: Ambulatory Visit | Attending: Family Medicine | Admitting: Family Medicine

## 2014-06-11 DIAGNOSIS — Z87891 Personal history of nicotine dependence: Secondary | ICD-10-CM

## 2014-06-11 DIAGNOSIS — Z833 Family history of diabetes mellitus: Secondary | ICD-10-CM | POA: Diagnosis not present

## 2014-06-11 DIAGNOSIS — Z3A40 40 weeks gestation of pregnancy: Secondary | ICD-10-CM | POA: Diagnosis present

## 2014-06-11 DIAGNOSIS — Z8249 Family history of ischemic heart disease and other diseases of the circulatory system: Secondary | ICD-10-CM

## 2014-06-11 DIAGNOSIS — F319 Bipolar disorder, unspecified: Secondary | ICD-10-CM | POA: Diagnosis present

## 2014-06-11 DIAGNOSIS — Z823 Family history of stroke: Secondary | ICD-10-CM

## 2014-06-11 DIAGNOSIS — IMO0001 Reserved for inherently not codable concepts without codable children: Secondary | ICD-10-CM

## 2014-06-11 DIAGNOSIS — O99344 Other mental disorders complicating childbirth: Secondary | ICD-10-CM | POA: Diagnosis present

## 2014-06-11 LAB — CBC
HEMATOCRIT: 37.3 % (ref 36.0–46.0)
HEMOGLOBIN: 13 g/dL (ref 12.0–15.0)
MCH: 31.6 pg (ref 26.0–34.0)
MCHC: 34.9 g/dL (ref 30.0–36.0)
MCV: 90.5 fL (ref 78.0–100.0)
Platelets: 271 10*3/uL (ref 150–400)
RBC: 4.12 MIL/uL (ref 3.87–5.11)
RDW: 13.9 % (ref 11.5–15.5)
WBC: 17.5 10*3/uL — AB (ref 4.0–10.5)

## 2014-06-11 LAB — RPR

## 2014-06-11 MED ORDER — LACTATED RINGERS IV SOLN
INTRAVENOUS | Status: DC
  Administered 2014-06-11 (×2): via INTRAVENOUS

## 2014-06-11 MED ORDER — ACETAMINOPHEN 325 MG PO TABS: 650.0000 mg | ORAL_TABLET | ORAL | Status: DC | PRN

## 2014-06-11 MED ORDER — DIPHENHYDRAMINE HCL 50 MG/ML IJ SOLN: 12.5000 mg | INTRAMUSCULAR | Status: DC | PRN

## 2014-06-11 MED ORDER — OXYTOCIN 40 UNITS IN LACTATED RINGERS INFUSION - SIMPLE MED
62.5000 mL/h | INTRAVENOUS | Status: DC
  Filled 2014-06-11: qty 1000

## 2014-06-11 MED ORDER — LACTATED RINGERS IV SOLN
500.0000 mL | Freq: Once | INTRAVENOUS | Status: AC
  Administered 2014-06-11: 500 mL via INTRAVENOUS

## 2014-06-11 MED ORDER — CITRIC ACID-SODIUM CITRATE 334-500 MG/5ML PO SOLN
30.0000 mL | ORAL | Status: DC | PRN
  Administered 2014-06-11: 30 mL via ORAL

## 2014-06-11 MED ORDER — FENTANYL 2.5 MCG/ML BUPIVACAINE 1/10 % EPIDURAL INFUSION (WH - ANES)
14.0000 mL/h | INTRAMUSCULAR | Status: DC | PRN
  Administered 2014-06-11: 14 mL/h via EPIDURAL

## 2014-06-11 MED ORDER — IBUPROFEN 600 MG PO TABS
ORAL_TABLET | ORAL | Status: AC
  Administered 2014-06-11: 600 mg via ORAL
  Filled 2014-06-11: qty 1

## 2014-06-11 MED ORDER — SERTRALINE HCL 50 MG PO TABS
50.0000 mg | ORAL_TABLET | Freq: Every day | ORAL | Status: DC
  Filled 2014-06-11 (×2): qty 1

## 2014-06-11 MED ORDER — LIDOCAINE HCL (PF) 1 % IJ SOLN
INTRAMUSCULAR | Status: DC | PRN
  Administered 2014-06-11 (×4): 4 mL

## 2014-06-11 MED ORDER — LACTATED RINGERS IV SOLN: 500.0000 mL | INTRAVENOUS | Status: DC | PRN

## 2014-06-11 MED ORDER — EPHEDRINE 5 MG/ML INJ
10.0000 mg | INTRAVENOUS | Status: DC | PRN
Start: 2014-06-11 — End: 2014-06-12
  Filled 2014-06-11: qty 2

## 2014-06-11 MED ORDER — EPHEDRINE 5 MG/ML INJ
10.0000 mg | INTRAVENOUS | Status: DC | PRN
  Filled 2014-06-11: qty 2

## 2014-06-11 MED ORDER — OXYTOCIN BOLUS FROM INFUSION
500.0000 mL | INTRAVENOUS | Status: DC
  Administered 2014-06-11: 500 mL via INTRAVENOUS

## 2014-06-11 MED ORDER — PHENYLEPHRINE 40 MCG/ML (10ML) SYRINGE FOR IV PUSH (FOR BLOOD PRESSURE SUPPORT)
80.0000 ug | PREFILLED_SYRINGE | INTRAVENOUS | Status: DC | PRN
  Filled 2014-06-11: qty 2

## 2014-06-11 MED ORDER — ONDANSETRON HCL 4 MG/2ML IJ SOLN: 4.0000 mg | Freq: Four times a day (QID) | INTRAMUSCULAR | Status: DC | PRN

## 2014-06-11 MED ORDER — LIDOCAINE HCL (PF) 1 % IJ SOLN
30.0000 mL | INTRAMUSCULAR | Status: DC | PRN
  Filled 2014-06-11: qty 30

## 2014-06-11 MED ORDER — FENTANYL CITRATE 0.05 MG/ML IJ SOLN: 100.0000 ug | INTRAMUSCULAR | Status: DC | PRN

## 2014-06-11 MED ORDER — IBUPROFEN 600 MG PO TABS
600.0000 mg | ORAL_TABLET | Freq: Four times a day (QID) | ORAL | Status: DC
  Administered 2014-06-11 – 2014-06-12 (×4): 600 mg via ORAL
  Filled 2014-06-11 (×3): qty 1

## 2014-06-11 NOTE — OB Triage Note (Signed)
Patient reports feeling contractions since 11am on 12/4. UCs have been 5-10 minutes apart, patient reports as strong. Patient reports no leaking of fluid or vaginal bleeding, reports slight decrease in fetal movement.

## 2014-06-11 NOTE — Progress Notes (Signed)
Linda Jackson is a 23 y.o. [phone] at 8363w1d admitted for spontaneous onset of labor  Subjective: Doing well, resting comfortably  Objective: BP 113/77 mmHg  Pulse 84  Temp(Src) 98.1 F (36.7 C) (Oral)  Resp 20  Ht 5\' 1"  (1.549 m)  Wt 183 lb (83.008 kg)  BMI 34.60 kg/m2  SpO2 97%  LMP 09/22/2013    FHT:  FHR: 125 bpm, variability: moderate,  accelerations:  Present,  decelerations:  Absent UC:   regular, every 2 minutes  SVE:   Dilation: 6  Effacement: 90%   Station: 0   Labs: Lab Results  Component Value Date   WBC 17.5* 06/11/2014   HGB 13.0 06/11/2014   HCT 37.3 06/11/2014   MCV 90.5 06/11/2014   PLT 271 06/11/2014    Assessment / Plan: Spontaneous labor, progressing normally  Labor: Progressing normally Fetal Wellbeing:  Category I Pain Control:  Epidural Pre-eclampsia: N/A I/D:  N/A Anticipated MOD:  NSVD  Marcellas Marchant SHWON Student NM 06/11/2014, 12:20 PM

## 2014-06-11 NOTE — Anesthesia Procedure Notes (Signed)
Epidural Patient location during procedure: OB Start time: 06/11/2014 10:00 AM  Staffing Anesthesiologist: Shanen Norris Performed by: anesthesiologist   Preanesthetic Checklist Completed: patient identified, site marked, surgical consent, pre-op evaluation, timeout performed, IV checked, risks and benefits discussed and monitors and equipment checked  Epidural Patient position: sitting Prep: site prepped and draped and DuraPrep Patient monitoring: continuous pulse ox and blood pressure Approach: midline Location: L3-L4 Injection technique: LOR air  Needle:  Needle type: Tuohy  Needle gauge: 17 G Needle length: 9 cm and 9 Needle insertion depth: 6 cm Catheter type: closed end flexible Catheter size: 19 Gauge Catheter at skin depth: 11 cm Test dose: negative  Assessment Events: blood not aspirated, injection not painful, no injection resistance, negative IV test and no paresthesia  Additional Notes Discussed risk of headache, infection, bleeding, nerve injury and failed or incomplete block.  Patient voices understanding and wishes to proceed.  Epidural placed easily on first attempt. No paresthesia.  Patient tolerated procedure well with no apparent complications.  Jasmine DecemberA> Linda Jackson, MDReason for block:procedure for pain

## 2014-06-11 NOTE — Anesthesia Preprocedure Evaluation (Signed)
Anesthesia Evaluation  Patient identified by MRN, date of birth, ID band Patient awake    Reviewed: Allergy & Precautions, H&P , NPO status , Patient's Chart, lab work & pertinent test results, reviewed documented beta blocker date and time   History of Anesthesia Complications Negative for: history of anesthetic complications  Airway Mallampati: II  TM Distance: >3 FB Neck ROM: full    Dental  (+) Teeth Intact   Pulmonary neg pulmonary ROS, former smoker,  breath sounds clear to auscultation        Cardiovascular negative cardio ROS  Rhythm:regular Rate:Normal     Neuro/Psych Depression Bipolar Disorder Chronic back pain    GI/Hepatic negative GI ROS, Neg liver ROS,   Endo/Other  BMI 34.6  Renal/GU negative Renal ROS  negative genitourinary   Musculoskeletal   Abdominal   Peds  Hematology negative hematology ROS (+)   Anesthesia Other Findings   Reproductive/Obstetrics (+) Pregnancy                             Anesthesia Physical Anesthesia Plan  ASA: II  Anesthesia Plan: Epidural   Post-op Pain Management:    Induction:   Airway Management Planned:   Additional Equipment:   Intra-op Plan:   Post-operative Plan:   Informed Consent: I have reviewed the patients History and Physical, chart, labs and discussed the procedure including the risks, benefits and alternatives for the proposed anesthesia with the patient or authorized representative who has indicated his/her understanding and acceptance.     Plan Discussed with:   Anesthesia Plan Comments:         Anesthesia Quick Evaluation

## 2014-06-11 NOTE — H&P (Signed)
Linda Jackson is a 23 y.o. female presenting for SOL. History OB History    Gravida Para Term Preterm AB TAB SAB Ectopic Multiple Living   3 2 2       2      Past Medical History  Diagnosis Date  . Depression   . Bipolar 1 disorder   . Chronic back pain    Past Surgical History  Procedure Laterality Date  . Tonsillectomy    . Cholecystectomy    . Tympanostomy tube placement  1997   Family History: family history includes ADD / ADHD in her brother; COPD in her maternal grandmother; Cancer in her maternal grandfather; Depression in her mother; Diabetes in her paternal grandfather; Hypertension in her paternal grandfather; Kidney disease in her mother; Stroke in her mother. Social History:  reports that she has quit smoking. She has never used smokeless tobacco. She reports that she does not drink alcohol or use illicit drugs.   Prenatal Transfer Tool  Maternal Diabetes: No Genetic Screening: Declined Maternal Ultrasounds/Referrals: Normal Fetal Ultrasounds or other Referrals:  None Maternal Substance Abuse:  No Significant Maternal Medications:  Meds include: Zoloft Significant Maternal Lab Results:  None Other Comments:  None  Review of Systems  Constitutional: Negative.   HENT: Negative.   Eyes: Negative.   Respiratory: Negative.   Cardiovascular: Negative.   Genitourinary: Negative.   Musculoskeletal: Negative.   Skin:       Reports very dry skin during pregnancy  Neurological: Negative.   Endo/Heme/Allergies: Negative.   Psychiatric/Behavioral: Negative.     Dilation: 5 Effacement (%): 90 Station: -1 Exam by:: Linda Jackson RNC Blood pressure 120/82, pulse 109, temperature 98 F (36.7 C), temperature source Oral, resp. rate 20, height 5\' 1"  (1.549 m), weight 183 lb (83.008 kg), last menstrual period 09/22/2013. Maternal Exam:  Uterine Assessment: Contraction strength is moderate.  Contraction duration is 80 seconds. Contraction frequency is irregular.    Abdomen: Patient reports no abdominal tenderness. Fetal presentation: vertex  Introitus: Normal vulva. Normal vagina.  Pelvis: adequate for delivery.   Cervix: Cervix evaluated by digital exam.     Fetal Exam Fetal Monitor Review: Mode: hand-held doppler probe.   Baseline rate: 140.  Variability: moderate (6-25 bpm).   Pattern: accelerations present and no decelerations.    Fetal State Assessment: Category I - tracings are normal.     Physical Exam  Constitutional: She is oriented to person, place, and time. She appears well-developed and well-nourished.  HENT:  Head: Normocephalic.  Eyes: Pupils are equal, round, and reactive to light.  Neck: Normal range of motion. Neck supple.  Cardiovascular: Normal rate, regular rhythm and normal heart sounds.   Respiratory: Effort normal and breath sounds normal.  GI: Bowel sounds are normal.  Gravid abdomen, firm with contractions  Genitourinary: Vagina normal.  Musculoskeletal: Normal range of motion.  Neurological: She is alert and oriented to person, place, and time.  Skin: Skin is warm and dry.  Psychiatric: She has a normal mood and affect. Her behavior is normal.    Prenatal labs: ABO, Rh: --/--/O POS (06/04 0115) Antibody: NEG (07/27 1036) Rubella: 1.19 (07/27 1036) RPR: NON REAC (07/27 1036)  HBsAg: NEGATIVE (07/27 1036)  HIV: NONREACTIVE (07/27 1036)  GBS: NOT DETECTED (11/25 1605)   Assessment/Plan: IUP @ 1833w1d [id number] Expectant management of labor   Linda Jackson,Linda Jackson 06/11/2014, 9:00 AM  I spoke with and examined patient and agree with resident/PA/SNM's note and plan of care.  Early labor started yesterday  at 1100- has progressively worsened. +FM. No vb or lof.  [facility] pnc at [facility] 1st trimester. No care from 25-38wks d/t transportation issues. Didn't show for pn2, random cbg in office Monday in 70s. Plans on bottlefeeding. Liletta IUD for contraception.  Requesting epidural at this time. Cat I FHR.    Cheral MarkerKimberly R. Krisy Jackson, CNM, [facility]NP-BC 06/11/2014 9:49 AM

## 2014-06-11 NOTE — MAU Note (Signed)
[facility]

## 2014-06-11 NOTE — Plan of Care (Signed)
Problem: Phase I Progression Outcomes Goal: Pain controlled with appropriate interventions Outcome: Completed/Met Date Met:  06/11/14     

## 2014-06-11 NOTE — Progress Notes (Signed)
Ordered to recheck SVE in 1 to 1.5 hours. Patient may ambulate if she would like off the monitor.

## 2014-06-12 MED ORDER — LANOLIN HYDROUS EX OINT: TOPICAL_OINTMENT | CUTANEOUS | Status: DC | PRN

## 2014-06-12 MED ORDER — BENZOCAINE-MENTHOL 20-0.5 % EX AERO
1.0000 "application " | INHALATION_SPRAY | CUTANEOUS | Status: DC | PRN
  Administered 2014-06-12: 1 via TOPICAL
  Filled 2014-06-12: qty 56

## 2014-06-12 MED ORDER — DIBUCAINE 1 % RE OINT
1.0000 "application " | TOPICAL_OINTMENT | RECTAL | Status: DC | PRN
  Administered 2014-06-12: 1 via RECTAL
  Filled 2014-06-12: qty 28

## 2014-06-12 MED ORDER — WITCH HAZEL-GLYCERIN EX PADS
1.0000 "application " | MEDICATED_PAD | CUTANEOUS | Status: DC | PRN
  Administered 2014-06-12: 1 via TOPICAL

## 2014-06-12 MED ORDER — DIPHENHYDRAMINE HCL 25 MG PO CAPS: 25.0000 mg | ORAL_CAPSULE | Freq: Four times a day (QID) | ORAL | Status: DC | PRN

## 2014-06-12 MED ORDER — ZOLPIDEM TARTRATE 5 MG PO TABS: 5.0000 mg | ORAL_TABLET | Freq: Every evening | ORAL | Status: DC | PRN

## 2014-06-12 MED ORDER — TETANUS-DIPHTH-ACELL PERTUSSIS 5-2.5-18.5 LF-MCG/0.5 IM SUSP: 0.5000 mL | Freq: Once | INTRAMUSCULAR | Status: DC

## 2014-06-12 MED ORDER — SIMETHICONE 80 MG PO CHEW: 80.0000 mg | CHEWABLE_TABLET | ORAL | Status: DC | PRN

## 2014-06-12 MED ORDER — PRENATAL MULTIVITAMIN CH
1.0000 | ORAL_TABLET | Freq: Every day | ORAL | Status: DC
  Administered 2014-06-12: 1 via ORAL
  Filled 2014-06-12: qty 1

## 2014-06-12 MED ORDER — SENNOSIDES-DOCUSATE SODIUM 8.6-50 MG PO TABS
2.0000 | ORAL_TABLET | ORAL | Status: DC
  Administered 2014-06-12: 2 via ORAL
  Filled 2014-06-12: qty 2

## 2014-06-12 MED ORDER — ONDANSETRON HCL 4 MG PO TABS: 4.0000 mg | ORAL_TABLET | ORAL | Status: DC | PRN

## 2014-06-12 MED ORDER — ONDANSETRON HCL 4 MG/2ML IJ SOLN: 4.0000 mg | INTRAMUSCULAR | Status: DC | PRN

## 2014-06-12 MED ORDER — OXYCODONE-ACETAMINOPHEN 5-325 MG PO TABS: 2.0000 | ORAL_TABLET | ORAL | Status: DC | PRN

## 2014-06-12 MED ORDER — OXYCODONE-ACETAMINOPHEN 5-325 MG PO TABS
1.0000 | ORAL_TABLET | ORAL | Status: DC | PRN
Start: 2014-06-12 — End: 2014-06-12

## 2014-06-12 NOTE — Plan of Care (Signed)
Problem: Discharge Progression Outcomes Goal: Barriers To Progression Addressed/Resolved Outcome: Not Applicable Date Met:  40/76/80 Goal: Activity appropriate for discharge plan Outcome: Completed/Met Date Met:  06/12/14 Goal: Tolerating diet Outcome: Completed/Met Date Met:  88/11/03 Goal: Complications resolved/controlled Outcome: Completed/Met Date Met:  06/12/14 Goal: Pain controlled with appropriate interventions Outcome: Completed/Met Date Met:  06/12/14 Goal: Afebrile, VS remain stable at discharge Outcome: Completed/Met Date Met:  06/12/14 Goal: Discharge plan in place and appropriate Outcome: Completed/Met Date Met:  06/12/14 Goal: Other Discharge Outcomes/Goals Outcome: Not Applicable Date Met:  15/94/58

## 2014-06-12 NOTE — Plan of Care (Signed)
Problem: Consults Goal: Postpartum Patient Education (See Patient Education module for education specifics.)  Outcome: Completed/Met Date Met:  06/12/14  Problem: Phase I Progression Outcomes Goal: VS, stable, temp < 100.4 degrees F Outcome: Completed/Met Date Met:  06/12/14 Goal: Initial discharge plan identified Outcome: Completed/Met Date Met:  06/12/14 Goal: Other Phase I Outcomes/Goals Outcome: Not Applicable Date Met:  59/47/07

## 2014-06-12 NOTE — Discharge Summary (Signed)
Obstetric Discharge Summary Reason for Admission: onset of labor Prenatal Procedures: ultrasound Intrapartum Procedures: spontaneous vaginal delivery Postpartum Procedures: none Complications-Operative and Postpartum: none Eating, drinking, voiding, ambulating well. Lochia and pain wnl.  Denies dizziness, lightheadedness, or sob. No complaints.   Hospital Course: Linda Jackson is a 23 y.o. [id number] female admited at 5040.1wks in active labor. She progressed normally to NSVB. Her pp course has been uncomplicated.  By PPD#1 she is doing well, requests early d/c, and is deemed to have received the full benefit of her hospital stay.  Filed Vitals:   06/12/14 0632  BP: 114/79  Pulse: 82  Temp: 98 F (36.7 C)  Resp: 16   H/H: Lab Results  Component Value Date/Time   HGB 13.0 06/11/2014 08:25 AM   HCT 37.3 06/11/2014 08:25 AM    Physical Exam: General: alert, cooperative and no distress Abdomen/Uterine Fundus: Appropriately tender, non-distended, FF @ U-2 Incision: n/a Lochia: appropriate Extremities: No evidence of DVT seen on physical exam. Negative Homan's sign, no cords, calf tenderness, or significant calf/ankle edema   Discharge Diagnoses: Term Pregnancy-delivered  Discharge Information: Date: 01/17/2011 Activity: pelvic rest Diet: routine  Medications: PNV, Ibuprofen and Colace Breast feeding: Yes Contraception: wants IUD Circumcision: n/a Condition: stable Instructions: refer to handout Discharge to: home  Infant: Home with mother pending peds  Follow-up Information    Follow up with [facility].   Specialty:  Obstetrics and Gynecology   Why:  4-6 weeks for your postpartum visit   Contact information:   [address] [place] [id number] [phone]     SW consult d/t no pnc from 25-38wks. Also h/o depression, states she stopped her zoloft 1 month ago and feels she is doing fine. NO SI/HI/II. Recommended restarting zoloft during  pp period.  Linda Jackson, Linda Jackson, CNM, WHNP-BC 06/12/2014,7:27 AM

## 2014-06-12 NOTE — Discharge Instructions (Signed)
NO SEX UNTIL AFTER YOUR IUD IS PLACED  Postpartum Care After Vaginal Delivery After you deliver your newborn (postpartum period), the usual stay in the hospital is 24-72 hours. If there were problems with your labor or delivery, or if you have other medical problems, you might be in the hospital longer.  While you are in the hospital, you will receive help and instructions on how to care for yourself and your newborn during the postpartum period.  While you are in the hospital:  Be sure to tell your nurses if you have pain or discomfort, as well as where you feel the pain and what makes the pain worse.  If you had an incision made near your vagina (episiotomy) or if you had some tearing during delivery, the nurses may put ice packs on your episiotomy or tear. The ice packs may help to reduce the pain and swelling.  If you are breastfeeding, you may feel uncomfortable contractions of your uterus for a couple of weeks. This is normal. The contractions help your uterus get back to normal size.  It is normal to have some bleeding after delivery.  For the first 1-3 days after delivery, the flow is red and the amount may be similar to a period.  It is common for the flow to start and stop.  In the first few days, you may pass some small clots. Let your nurses know if you begin to pass large clots or your flow increases.  Do not  flush blood clots down the toilet before having the nurse look at them.  During the next 3-10 days after delivery, your flow should become more watery and pink or brown-tinged in color.  Ten to fourteen days after delivery, your flow should be a small amount of yellowish-white discharge.  The amount of your flow will decrease over the first few weeks after delivery. Your flow may stop in 6-8 weeks. Most women have had their flow stop by 12 weeks after delivery.  You should change your sanitary pads frequently.  Wash your hands thoroughly with soap and water for at  least 20 seconds after changing pads, using the toilet, or before holding or feeding your newborn.  You should feel like you need to empty your bladder within the first 6-8 hours after delivery.  In case you become weak, lightheaded, or faint, call your nurse before you get out of bed for the first time and before you take a shower for the first time.  Within the first few days after delivery, your breasts may begin to feel tender and full. This is called engorgement. Breast tenderness usually goes away within 48-72 hours after engorgement occurs. You may also notice milk leaking from your breasts. If you are not breastfeeding, do not stimulate your breasts. Breast stimulation can make your breasts produce more milk.  Spending as much time as possible with your newborn is very important. During this time, you and your newborn can feel close and get to know each other. Having your newborn stay in your room (rooming in) will help to strengthen the bond with your newborn. It will give you time to get to know your newborn and become comfortable caring for your newborn.  Your hormones change after delivery. Sometimes the hormone changes can temporarily cause you to feel sad or tearful. These feelings should not last more than a few days. If these feelings last longer than that, you should talk to your caregiver.  If desired, talk to  your caregiver about methods of family planning or contraception.  Talk to your caregiver about immunizations. Your caregiver may want you to have the following immunizations before leaving the hospital:  Tetanus, diphtheria, and pertussis (Tdap) or tetanus and diphtheria (Td) immunization. It is very important that you and your family (including grandparents) or others caring for your newborn are up-to-date with the Tdap or Td immunizations. The Tdap or Td immunization can help protect your newborn from getting ill.  Rubella immunization.  Varicella (chickenpox)  immunization.  Influenza immunization. You should receive this annual immunization if you did not receive the immunization during your pregnancy. Document Released: 04/21/2007 Document Revised: 03/18/2012 Document Reviewed: 02/19/2012 [facility] Patient Information 2015 [employer] [place]. This information is not intended to replace advice given to you by your health care provider. Make sure you discuss any questions you have with your health care provider.

## 2014-06-12 NOTE — Progress Notes (Signed)
Clinical Social Work Department PSYCHOSOCIAL ASSESSMENT - MATERNAL/CHILD 06/12/2014  Patient:  Linda Jackson, Linda Jackson  Account Number:  [id number]  Admit Date:  06/11/2014  Ardine Eng Name:   Ralene Cork    Clinical Social Worker:  Danitza Schoenfeldt, LCSW   Date/Time:  06/12/2014 11:00 AM  Date Referred:  06/11/2014   Referral source  Central Nursery     Referred reason  Behavioral Health Issues   Other referral source:    I:  FAMILY / HOME ENVIRONMENT Child's legal guardian:  PARENT  Guardian - Name Guardian - Age Guardian - Address  [place] [place], [place]  [place], [place]  same as above   Other household support members/support persons Other support:   Resides with maternal great grandmother    II  PSYCHOSOCIAL DATA Information Source:    Occupational hygienist Employment:   Spouse is employed   Museum/gallery curator resources:  [employer] / Grade:   Maternity Care Coordinator / Child Services Coordination / Early Interventions:  Cultural issues impacting care:    III  STRENGTHS Strengths  Supportive family/friends  Home prepared for Child (including basic supplies)  Adequate Resources   Strength comment:    IV  RISK FACTORS AND CURRENT PROBLEMS Current Problem:       V  SOCIAL WORK ASSESSMENT Acknowledged order for CSW consult to assess mother's hx of bipolar /depression, and limited prenatal care.  Met with both parents.  They were pleasant and receptive to social work intervention.  Parent are married and have two other dependents ages 27 and 74.   Mother reports hx of mental illness.  Informed that she was diagnosed with bipolar and depression as a teenager.  She was reportedly prescribe medication, but did not participated in therapy.  Mother states that she has been stable and off mediation for the past 5 years.   She denies any hx of psychiatric hospitalization.    She also denies any current  symptoms of anxiety or depression.    No hx of substance abuse reported.  Mother states that there was a lapse in prenatal care due to transportation issues.  No acute social concerns noted or reported at this time.  Good bonding noted with newborn.   Parents informed of CSW availability.      VI SOCIAL WORK PLAN Social Work Plan  No Further Intervention Required / No Barriers to Discharge   Type of pt/family education:   Hospital's drug screen policy  PP Depression information and resource   If child protective services report - county:   If child protective services report - date:   Information/referral to community resources comment:   Other social work plan:

## 2014-06-12 NOTE — Plan of Care (Signed)
Problem: Phase II Progression Outcomes Goal: Pain controlled on oral analgesia Outcome: Completed/Met Date Met:  06/12/14 Goal: Progress activity as tolerated unless otherwise ordered Outcome: Completed/Met Date Met:  06/12/14 Goal: Afebrile, VS remain stable Outcome: Completed/Met Date Met:  06/12/14 Goal: Tolerating diet Outcome: Completed/Met Date Met:  06/12/14 Goal: Other Phase II Outcomes/Goals Outcome: Not Applicable Date Met:  39/03/00

## 2014-06-12 NOTE — Anesthesia Postprocedure Evaluation (Signed)
Anesthesia Post Note  Patient: Linda Jackson  Procedure(s) Performed: * No procedures listed *  Anesthesia type: Epidural  Patient location: Mother/Baby  Post pain: Pain level controlled  Post assessment: Post-op Vital signs reviewed  Last Vitals:  Filed Vitals:   06/12/14 0632  BP: 114/79  Pulse: 82  Temp: 36.7 C  Resp: 16    Post vital signs: Reviewed  Level of consciousness:alert  Complications: No apparent anesthesia complications

## 2014-06-13 NOTE — Progress Notes (Signed)
Post discharge ur review completed. 

## 2014-06-16 ENCOUNTER — Other Ambulatory Visit: Payer: Medicaid Other | Admitting: Advanced Practice Midwife

## 2014-07-19 ENCOUNTER — Encounter: Payer: Self-pay | Admitting: Advanced Practice Midwife

## 2014-07-19 ENCOUNTER — Ambulatory Visit (INDEPENDENT_AMBULATORY_CARE_PROVIDER_SITE_OTHER): Payer: Medicaid Other | Admitting: Advanced Practice Midwife

## 2014-07-19 DIAGNOSIS — Z3202 Encounter for pregnancy test, result negative: Secondary | ICD-10-CM

## 2014-07-19 DIAGNOSIS — Z3200 Encounter for pregnancy test, result unknown: Secondary | ICD-10-CM

## 2014-07-19 LAB — POCT URINE PREGNANCY: Preg Test, Ur: NEGATIVE
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
## 2014-07-29 ENCOUNTER — Other Ambulatory Visit: Payer: Medicaid Other

## 2014-08-02 ENCOUNTER — Ambulatory Visit: Payer: Medicaid Other | Admitting: Adult Health

## 2014-08-02 ENCOUNTER — Ambulatory Visit (INDEPENDENT_AMBULATORY_CARE_PROVIDER_SITE_OTHER): Payer: Medicaid Other | Admitting: Obstetrics and Gynecology

## 2014-08-02 ENCOUNTER — Encounter: Payer: Self-pay | Admitting: Obstetrics and Gynecology

## 2014-08-02 VITALS — BP 120/76 | Ht 61.0 in | Wt 168.0 lb

## 2014-08-02 DIAGNOSIS — Z3200 Encounter for pregnancy test, result unknown: Secondary | ICD-10-CM

## 2014-08-02 DIAGNOSIS — Z30430 Encounter for insertion of intrauterine contraceptive device: Secondary | ICD-10-CM

## 2014-08-02 DIAGNOSIS — Z3202 Encounter for pregnancy test, result negative: Secondary | ICD-10-CM

## 2014-08-02 DIAGNOSIS — Z30014 Encounter for initial prescription of intrauterine contraceptive device: Secondary | ICD-10-CM

## 2014-08-02 LAB — POCT URINE PREGNANCY: PREG TEST UR: NEGATIVE

## 2014-08-02 NOTE — Progress Notes (Signed)
Patient ID: Linda Jackson, female   DOB: 1990-10-23, 24 y.o.   MRN: [id number] Pt here today for IUD insertion. Pt states that she last had sex about 2 weeks ago and that she started her period this past Friday.   GYNECOLOGY CLINIC PROCEDURE NOTE  Linda Cirriora B Skowronek is a 24 y.o. [phone] here for [place] IUD insertion. No GYN concerns.    IUD Insertion Procedure Note Patient identified, informed consent performed, consent signed.   Discussed risks of irregular bleeding, cramping, infection, malpositioning or misplacement of the IUD outside the uterus which may require further procedure such as laparoscopy. Time out was performed.  Urine pregnancy test negative.  Speculum placed in the vagina.  Cervix visualized.  Cleaned with Betadine x 2.  Grasped anteriorly with a single tooth tenaculum.  Uterus sounded to 9 cm.  Liletta IUD placed per manufacturer's recommendations.  Strings trimmed to 3 cm. Tenaculum was removed, good hemostasis noted.  Patient tolerated procedure well.   Patient was given post-procedure instructions.  She was advised to have backup contraception for one week.  Patient was also asked to check IUD strings periodically and follow up in 4 weeks for IUD check.   Tilda BurrowFERGUSON,Kaylaann Mountz V  Attending Obstetrician & Gynecologist Center for [facility], [facility]

## 2014-08-30 ENCOUNTER — Ambulatory Visit: Payer: Medicaid Other | Admitting: Obstetrics and Gynecology

## 2014-12-31 ENCOUNTER — Emergency Department (HOSPITAL_COMMUNITY)
Admission: EM | Admit: 2014-12-31 | Discharge: 2014-12-31 | Disposition: A | Discharge status: Home / Self Care | Payer: Medicaid Other | Attending: Emergency Medicine | Admitting: Emergency Medicine

## 2014-12-31 ENCOUNTER — Encounter (HOSPITAL_COMMUNITY): Payer: Self-pay | Admitting: Emergency Medicine

## 2014-12-31 DIAGNOSIS — Z87891 Personal history of nicotine dependence: Secondary | ICD-10-CM | POA: Insufficient documentation

## 2014-12-31 DIAGNOSIS — F419 Anxiety disorder, unspecified: Secondary | ICD-10-CM | POA: Insufficient documentation

## 2014-12-31 DIAGNOSIS — F410 Panic disorder [episodic paroxysmal anxiety] without agoraphobia: Secondary | ICD-10-CM

## 2014-12-31 DIAGNOSIS — G8929 Other chronic pain: Secondary | ICD-10-CM | POA: Insufficient documentation

## 2014-12-31 MED ORDER — LORAZEPAM 1 MG PO TABS
1.0000 mg | ORAL_TABLET | Freq: Once | ORAL | Status: AC
  Administered 2014-12-31: 1 mg via ORAL
  Filled 2014-12-31: qty 1

## 2014-12-31 NOTE — ED Provider Notes (Signed)
CSN: [id number]     Arrival date & time 12/31/14  1415 History   First MD Initiated Contact with Patient 12/31/14 1427     Chief Complaint  Patient presents with  . Panic Attack     The history is provided by the patient. No language interpreter was used.   Linda Jackson presents for evaluation of anxiety/panic attack.  She has a hx/o anxiety and states that she has experienced increased anxiety since yesterday when she learned she will need to be out of her house in the next thirty days.  She lives at a friend's house currently with her husband and three children.  She just recently got a job and received her first paycheck yesterday.  She is upset because she is unsure how to get a new place to live and pay their current outstanding bills.  She reports feeling fatigued from working the last 7 days.  Yesterday, when her anxiety began she developed central, sharp, intermittent chest pain with SOB.  Pain at times radiates to her back.  It lasts about 15 minutes at a time and has no alleviating or worsening factors.  She denies fevers, new abdominal pain, V/D, leg swelling.  She has an IUD since Feb.  No hx/o DVT/PE.  Sxs are moderate, waxing and waning, worsening.    Past Medical History  Diagnosis Date  . Depression   . Bipolar 1 disorder   . Chronic back pain    Past Surgical History  Procedure Laterality Date  . Tonsillectomy    . Cholecystectomy    . Tympanostomy tube placement  1997   Family History  Problem Relation Age of Onset  . Depression Mother   . Kidney disease Mother   . Stroke Mother   . ADD / ADHD Brother   . COPD Maternal Grandmother   . Cancer Maternal Grandfather     brain  . Diabetes Paternal Grandfather   . Hypertension Paternal Grandfather    History  Substance Use Topics  . Smoking status: Former Games developer  . Smokeless tobacco: Never Used  . Alcohol Use: No   OB History    Gravida Para Term Preterm AB TAB SAB Ectopic Multiple Living   3 3 3       0 3      Review of Systems  All other systems reviewed and are negative.     Allergies  Morphine and related; Cefzil; and Lexapro  Home Medications   Prior to Admission medications   Medication Sig Start Date End Date Taking? Authorizing Provider  acetaminophen (TYLENOL) 500 MG tablet Take 1,000 mg by mouth daily as needed for mild pain, moderate pain or headache.     Historical Provider, MD   BP 120/84 mmHg  Pulse 78  Temp(Src) 98.5 F (36.9 C) (Oral)  Resp 18  SpO2 99% Physical Exam  Constitutional: She is oriented to person, place, and time. She appears well-developed and well-nourished.  HENT:  Head: Normocephalic and atraumatic.  Cardiovascular: Normal rate and regular rhythm.   No murmur heard. Pulmonary/Chest: Effort normal and breath sounds normal. No respiratory distress.  Abdominal: Soft. There is no tenderness. There is no rebound and no guarding.  Musculoskeletal: She exhibits no edema or tenderness.  Neurological: She is alert and oriented to person, place, and time.  Skin: Skin is warm and dry.  Psychiatric:  Anxious and tearful, no SI/HI  Nursing note and vitals reviewed.   ED Course  Procedures (including critical care time) Labs Review  Labs Reviewed - No data to display  Imaging Review No results found.   EKG Interpretation   Date/Time:  Saturday December 31 2014 14:49:14 EDT Ventricular Rate:  85 PR Interval:  146 QRS Duration: 92 QT Interval:  350 QTC Calculation: 416 R Axis:   43 Text Interpretation:  Sinus rhythm Baseline wander in lead(s) V1 Confirmed  by Lincoln Brigham [phone]) on 12/31/2014 2:54:47 PM      MDM   Final diagnoses:  Anxiety attack    Pt here with chest pain and anxiety after increased stress at home.  Clinical picture not c/w ACS, PE, dissection, pna, ptx.  Pt improved on repeat eval.  No SI/HI or psychotic features.  Discussed with pt home care for anxiety, resources available, outpatient follow up, return precautions.      Tilden Fossa, MD 12/31/14 [phone]

## 2014-12-31 NOTE — ED Notes (Signed)
History of anxiety but anxiety has increased since yesterday.  Having personal issues regarding getting kick out of a friends house.  Given 30 days to move self and three kids out of friends house.  Pt says she got first check yesterday for $100, which the money was used to feeding and getting supplies for her children.  Pt says she was ask on yesterday to move out within 30 days and have not had chance to contact Social Services.

## 2014-12-31 NOTE — Discharge Instructions (Signed)
Panic Attacks °Panic attacks are sudden, short-lived surges of severe anxiety, fear, or discomfort. They may occur for no reason when you are relaxed, when you are anxious, or when you are sleeping. Panic attacks may occur for a number of reasons:  °· Healthy people occasionally have panic attacks in extreme, life-threatening situations, such as war or natural disasters. Normal anxiety is a protective mechanism of the body that helps us react to danger (fight or flight response). °· Panic attacks are often seen with anxiety disorders, such as panic disorder, social anxiety disorder, generalized anxiety disorder, and phobias. Anxiety disorders cause excessive or uncontrollable anxiety. They may interfere with your relationships or other life activities. °· Panic attacks are sometimes seen with other mental illnesses, such as depression and posttraumatic stress disorder. °· Certain medical conditions, prescription medicines, and drugs of abuse can cause panic attacks. °SYMPTOMS  °Panic attacks start suddenly, peak within 20 minutes, and are accompanied by four or more of the following symptoms: °· Pounding heart or fast heart rate (palpitations). °· Sweating. °· Trembling or shaking. °· Shortness of breath or feeling smothered. °· Feeling choked. °· Chest pain or discomfort. °· Nausea or strange feeling in your stomach. °· Dizziness, light-headedness, or feeling like you will faint. °· Chills or hot flushes. °· Numbness or tingling in your lips or hands and feet. °· Feeling that things are not real or feeling that you are not yourself. °· Fear of losing control or going crazy. °· Fear of dying. °Some of these symptoms can mimic serious medical conditions. For example, you may think you are having a heart attack. Although panic attacks can be very scary, they are not life threatening. °DIAGNOSIS  °Panic attacks are diagnosed through an assessment by your health care provider. Your health care provider will ask  questions about your symptoms, such as where and when they occurred. Your health care provider will also ask about your medical history and use of alcohol and drugs, including prescription medicines. Your health care provider may order blood tests or other studies to rule out a serious medical condition. Your health care provider may refer you to a mental health professional for further evaluation. °TREATMENT  °· Most healthy people who have one or two panic attacks in an extreme, life-threatening situation will not require treatment. °· The treatment for panic attacks associated with anxiety disorders or other mental illness typically involves counseling with a mental health professional, medicine, or a combination of both. Your health care provider will help determine what treatment is best for you. °· Panic attacks due to physical illness usually go away with treatment of the illness. If prescription medicine is causing panic attacks, talk with your health care provider about stopping the medicine, decreasing the dose, or substituting another medicine. °· Panic attacks due to alcohol or drug abuse go away with abstinence. Some adults need professional help in order to stop drinking or using drugs. °HOME CARE INSTRUCTIONS  °· Take all medicines as directed by your health care provider.   °· Schedule and attend follow-up visits as directed by your health care provider. It is important to keep all your appointments. °SEEK MEDICAL CARE IF: °· You are not able to take your medicines as prescribed. °· Your symptoms do not improve or get worse. °SEEK IMMEDIATE MEDICAL CARE IF:  °· You experience panic attack symptoms that are different than your usual symptoms. °· You have serious thoughts about hurting yourself or others. °· You are taking medicine for panic attacks and   have a serious side effect. °MAKE SURE YOU: °· Understand these instructions. °· Will watch your condition. °· Will get help right away if you are not  doing well or get worse. °Document Released: 06/24/2005 Document Revised: 06/29/2013 Document Reviewed: 02/05/2013 °ExitCare® Patient Information ©2015 [employer]. This information is not intended to replace advice given to you by your health care provider. Make sure you discuss any questions you have with your health care provider. ° °Emergency Department Resource Guide °1) Find a Doctor and Pay Out of Pocket °Although you won't have to find out who is covered by your insurance plan, it is a good idea to ask around and get recommendations. You will then need to call the office and see if the doctor you have chosen will accept you as a new patient and what types of options they offer for patients who are self-pay. Some doctors offer discounts or will set up payment plans for their patients who do not have insurance, but you will need to ask so you aren't surprised when you get to your appointment. ° °2) Contact Your Local Health Department °Not all health departments have doctors that can see patients for sick visits, but many do, so it is worth a call to see if yours does. If you don't know where your local health department is, you can check in your phone book. The CDC also has a tool to help you locate your state's health department, and many state websites also have listings of all of their local health departments. ° °3) Find a Walk-in Clinic °If your illness is not likely to be very severe or complicated, you may want to try a walk in clinic. These are popping up all over the country in pharmacies, drugstores, and shopping centers. They're usually staffed by nurse practitioners or physician assistants that have been trained to treat common illnesses and complaints. They're usually fairly quick and inexpensive. However, if you have serious medical issues or chronic medical problems, these are probably not your best option. ° °No Primary Care Doctor: °- Call [facility] at  832-8000 - they can help you  locate a primary care doctor that  accepts your insurance, provides certain services, etc. °- Physician Referral Service- 1-800-533-3463 ° °Chronic Pain Problems: °Organization         Address  Phone   Notes  °[facility]  (336) 297-2271 Patients need to be referred by their primary care doctor.  ° °Medication Assistance: °Organization         Address  Phone   Notes  °[place] [facility] [address] °[place], [place] [address] (336) 641-8030 --Must be a resident of [place] °-- Must have NO insurance coverage whatsoever (no Medicaid/ Medicare, etc.) °-- The pt. MUST have a primary care doctor that directs their care regularly and follows them in the community °  °[facility]  (866) 331-1348   °[employer]  (888) 892-1162   ° °Agencies that provide inexpensive medical care: °Organization         Address  Phone   Notes  °[facility]  (336) 832-8035   °[facility]    (336) 832-7272   °[facility] [address] °[place], [place] [address] (336) 832-4777   °[facility] [address], °[place] (336) 271-4999   °[facility]    (336) 373-0678   °[facility]    (336) 272-1050   °  [facility] ° [address], [place] Phone:  (336) 832-4444, Fax:  (336) 832-4440 Hours of Operation:  9 am - 6 pm, M-F.  Also accepts Medicaid/Medicare and self-pay.  °[facility] ° [address], [place] Phone: (336) 832-3150, Fax: (336) 832-3151. Hours of Operation:  8:30 am - 5:30 pm, M-F.  Also accepts Medicaid and self-pay.  °[facility] [place] [address], [place] Phone: (336) 878-6027   °[facility] [address], [place], [place] (336)723-1848, Ext. 123 Mondays & Thursdays: 7-9 AM.  First 15 patients are seen on a first come, first serve basis. °  ° °Medicaid-accepting [place]  Providers: ° °Organization         Address  Phone   Notes  °[facility] [address], [place] (336) 641-2100 Also accepts self-pay patients.  °[facility] [address], [place] ° (336) 856-9996   °[facility] [address], [place] (336) 288-8857   °[facility] [address] [place] [address], [place] (336) 299-7000   °Veita Bland [address], [place]  ° (336) 373-1557 Only accepts [place] Access Medicaid patients after they have their name applied to their card.  ° °Self-Pay (no insurance) in [place]: ° °Organization         Address  Phone   Notes  °Sickle Cell Patients, [facility] [address], [place] (336) 832-1970   °[facility] [address], [place] (336) 832-4400   °[facility] ° [address], [place] (336) 992-4800   °[facility]/Dr. Osei-Bonsu ° [address] [place] [address], [place] or [address], [place] (336) 841-8500 Phone number for both [place] and [place] locations is the same.  °[facility] [address], [place] (336) 299-0000   °[facility] [address] [place] [address], [place] or [address] (336) 852-7530 °(336) 878-2260   °[facility] [address],  (336) 350-1642, phone; (336) 294-5005, fax Sees patients 1st and 3rd Saturday of every month.  Must not qualify for public or private insurance (i.e. Medicaid, Medicare, [place] Health Choice, Veterans' Benefits) • Household income should be no more than 200% of the poverty level •The clinic cannot treat you if you are pregnant or think you are pregnant • Sexually transmitted diseases are not treated at the clinic.  ° ° °Dental Care: °Organization         Address  Phone  Notes  °[facility] [address],  (336) 641-6152 Accepts children up to age 21 who are enrolled in Medicaid or [place] Health Choice; pregnant women with a Medicaid card; and children who have applied for Medicaid or [place] Health Choice, but were declined, whose parents can pay a reduced fee at time of service.  °[facility] [place]  [address], [place] (336) 641-7733 Accepts children up to age 21 who are enrolled in Medicaid or [place] Health Choice; pregnant women with a Medicaid card; and children who have applied for Medicaid or [place] Health Choice, but were declined, whose parents can pay a reduced fee at time of service.  °[facility] ° [address], [place] (336) 641-4533 Patients are seen by appointment only. Walk-ins are not accepted. [facility] will see patients 18 years of age and older. °Monday - Tuesday (8am-5pm) °Most Wednesdays (8:30-5pm) °$30 per visit, cash only  °[facility] ° [address], [place] (336) 641-4533 Patients are seen by appointment only. Walk-ins are not accepted. [facility] will see patients 18 years of age and older. °One Wednesday Evening (Monthly: Volunteer Based).  $30 per visit, cash only  °[facility]  (919) 537-3737 for adults; Children under age 4, call Graduate Pediatric Dentistry at (919) [phone]. Children aged 4-14, please call (919) [phone] to request a pediatric application. ° Dental services are provided in all areas of dental care including fillings, crowns and bridges, complete and partial dentures, implants, gum treatment, root canals, and extractions. Preventive care is also provided. Treatment is provided to both adults and children. °Patients are selected via a lottery and there is often a waiting list. °  °[facility] [address], °[place] ° (336) 763-8833 [email or web address] °  °[facility]  [address], [place], [place] (336)723-1848, Ext. 123 Second and Fourth Thursday of each month, opens at 6:30 AM; Clinic ends at 9 AM.  Patients are seen on a first-come first-served basis, and a limited number are seen during each clinic.  ° °[facility] ° [address], [place], [place] (336) 723-7904   Eligibility Requirements °You must have lived in [place], [place], or [place] counties for at least the last three months. °  You cannot be eligible for state or federal sponsored healthcare insurance, including Veterans Administration, Medicaid, or Medicare. °  You generally cannot be eligible for healthcare insurance through your employer.  °  How to apply: °Eligibility screenings are held every Tuesday and Wednesday afternoon from 1:00 pm until 4:00 pm. You do not need an appointment for the interview!  °[facility] [address], [place], [place] 336-631-2330   °[facility]  336-342-8273   °[facility]  336-703-3100   °[facility]  336-570-6415   ° °Behavioral Health Resources in the Community: °Intensive Outpatient Programs °Organization         Address  Phone  Notes  °[facility] [address], [place], [place] 336-878-6098   °[facility] [address], [place], [place] 336-832-9800   °[facility] [address], [place], [place] ° 336-882-2125   °[facility] [address],  °[place], [place] 1-800-853-5163 or 336-641-4981   °Substance Abuse Resources °Organization         Address  Phone  Notes  °[facility]  336-882-2125   °[facility]  336-784-9470   °[facility]  336-285-9073   °[facility]  336-845-3988   °Residential & Outpatient Substance Abuse Program  1-800-659-3381   °Psychological Services °Organization         Address  Phone  Notes  °[facility]  336- 832-9600     °[facility]  336- 378-7881   °[facility] [address], [place] 1-800-853-5163 or 336-641-4981   ° °Mobile Crisis Teams °Organization         Address  Phone  Notes  °[facility], Mobile Crisis Care Unit  1-877-626-1772   °Assertive °[facility] ° [address] [place],  336-834-9664   °  Sharon DeEsch [address] °[place] 336-554-5454   ° °Self-Help/Support Groups °Organization         Address  Phone             Notes  °[facility] [place] - variety of support groups  336- 373-1402 Call for more information  °Narcotics Anonymous (NA), [facility] [address], °[place]  2 meetings at this location  ° °Residential Treatment Programs °Organization         Address  Phone  Notes  °[facility] [address],    °[place]  1-866-801-8205   °[facility] ° [address], [place], [place] 704-293-8524   °[facility] [address], [place] 336-845-3988 Admissions: 8am-3pm M-F  °[facility] [address]    °[place], [place] 336-841-1104   °[facility] [address], [place], [place] 336-379-7146   °[facility] [address]  °[place], [place] 336-285-9073   °[facility] - Intensive Outpatient [address], [place], [place] 336-852-3033   °[facility] [address]  °[place], [place] 1-877-615-2722 or 336-784-9470   °[facility] ([facility] [address] [place], [place] 336-227-7417 Accepts Medicaid  °[facility] [address]  ° [place] 1-800-659-3381 Substance Abuse/Addiction Treatment  ° °[place] Behavioral Health Resources °Organization         Address  Phone  Notes  °[facility]  (888) 581-9988   °Julie Brannon, PhD [address] [place], [place]   (336) 349-5553 or (336) 951-0000    °[facility]   [address] °[place], [place] (336) 349-4454   °[facility] [address], [place], [place] (336) 342-8316 Insurance/Medicaid/sponsorship through [facility]  °[facility] [address]                                    [place], [place] (336) 342-8316 Therapy/tele-psych/case  °[facility] [address]  ° [place], [place] (336) 349-2233    °Dr. Arfeen  (336) 349-4544   °[facility] [place]  [employer] [place] [facility] 1) [address], [place] °2) [address], [place] °3)  [address], [place] (336) 349-3220 °(336) 342-7768 ° °(336) 342-8140   °[place] [facility] (336) 342-1394 or (336) 342-3537 (After Hours)    ° ° ° °

## 2015-06-02 IMAGING — CR DG HAND COMPLETE 3+V*L*
3 series · 3 of 3 positions shown · non-contrast
Comparison: None.

CLINICAL DATA: Third digit pain after injury.

EXAM:
LEFT HAND - COMPLETE 3+ VIEW

[view not recorded (1 of 3)]
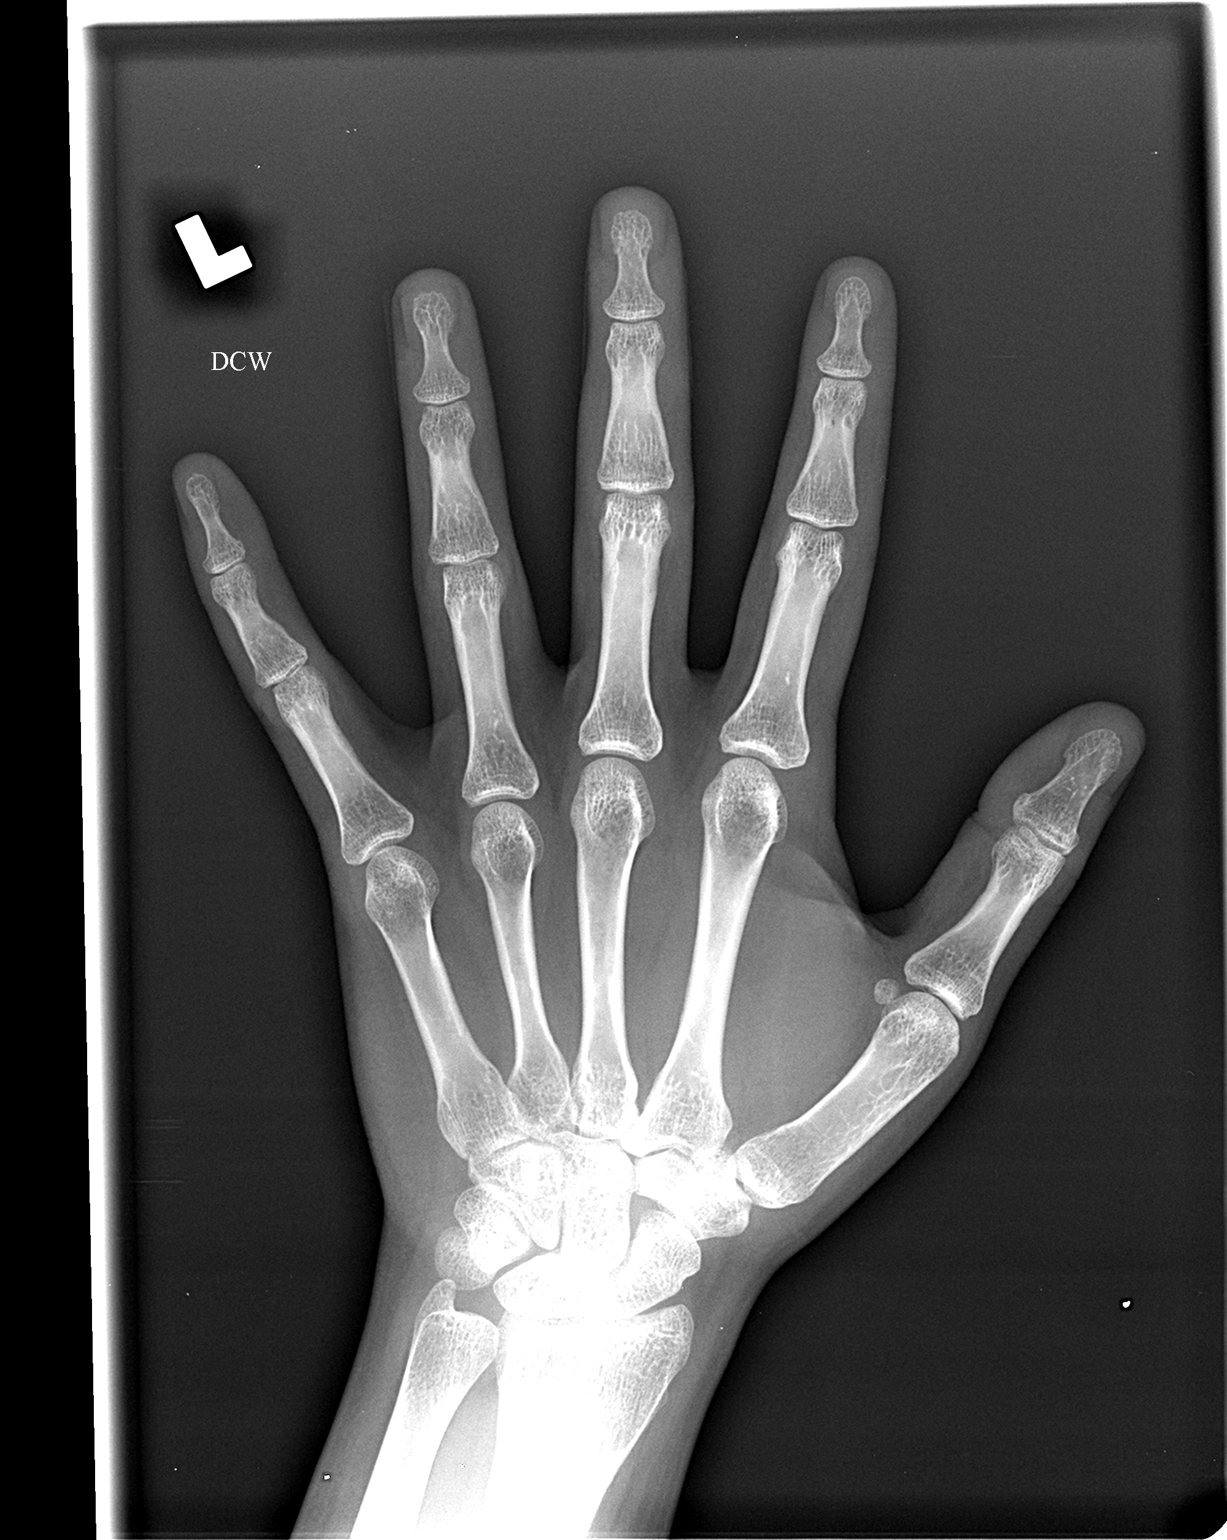

[view not recorded (2 of 3)]
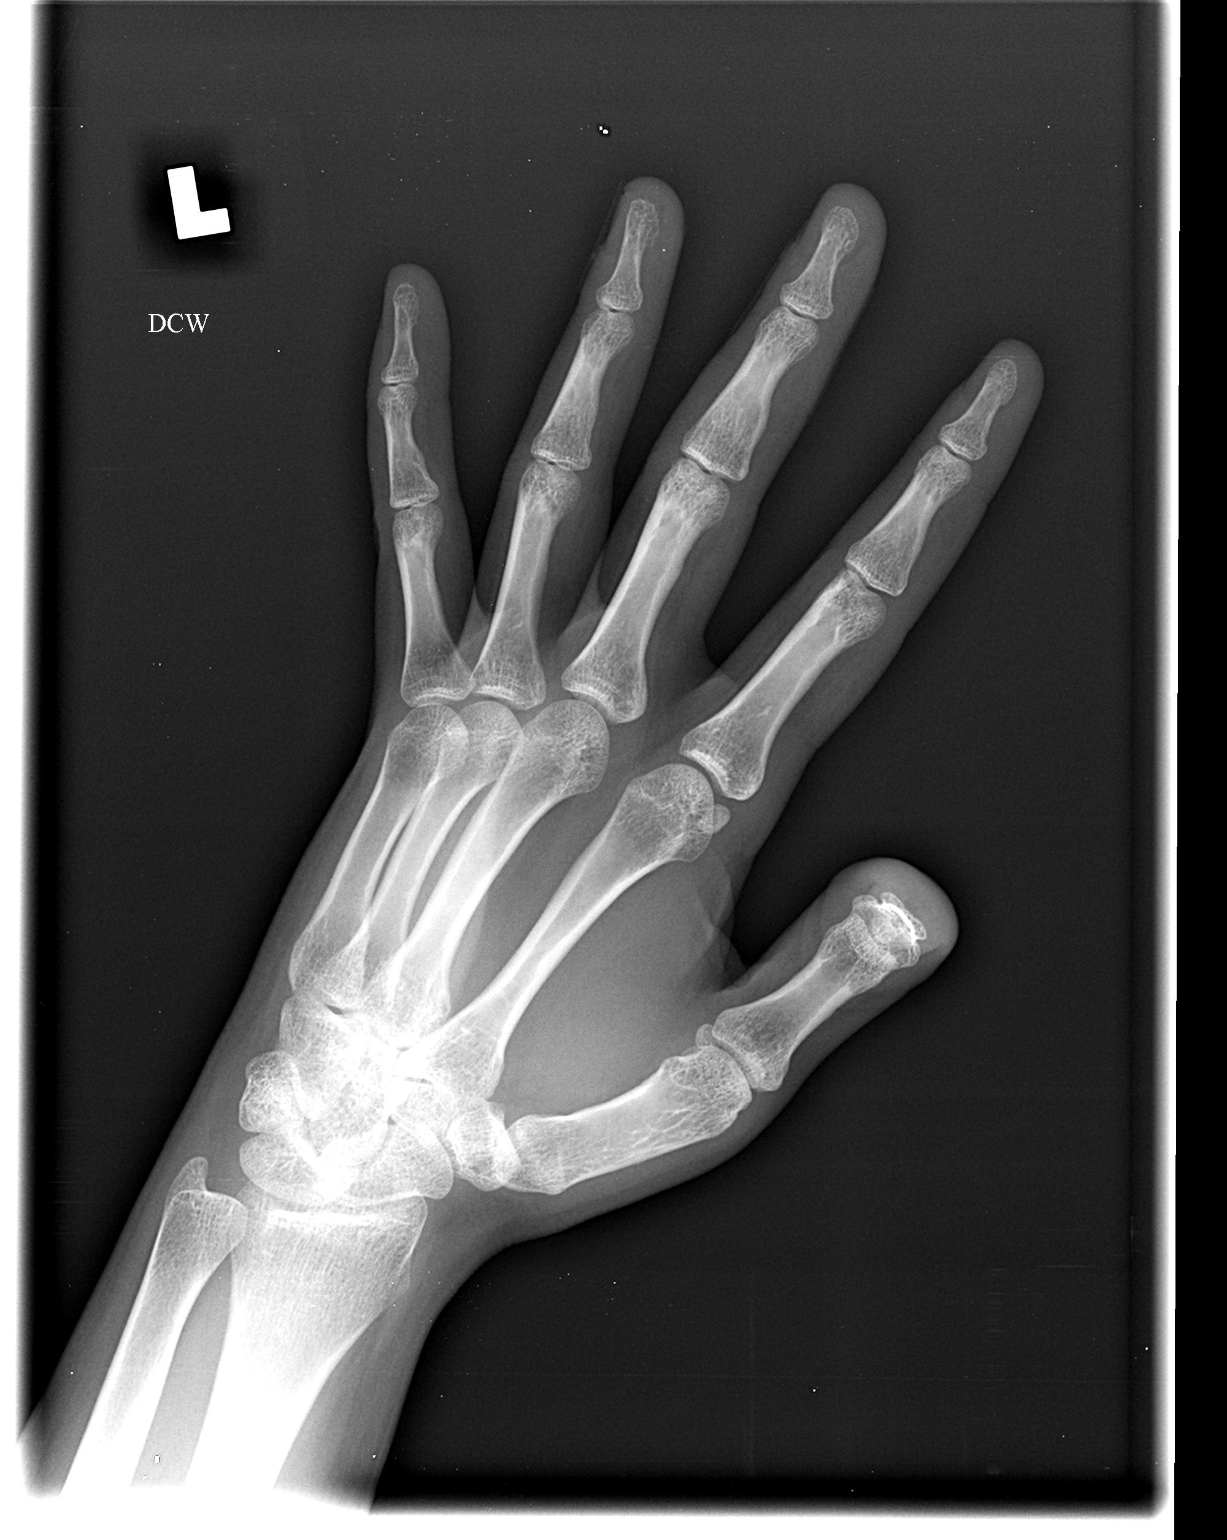

[view not recorded (3 of 3)]
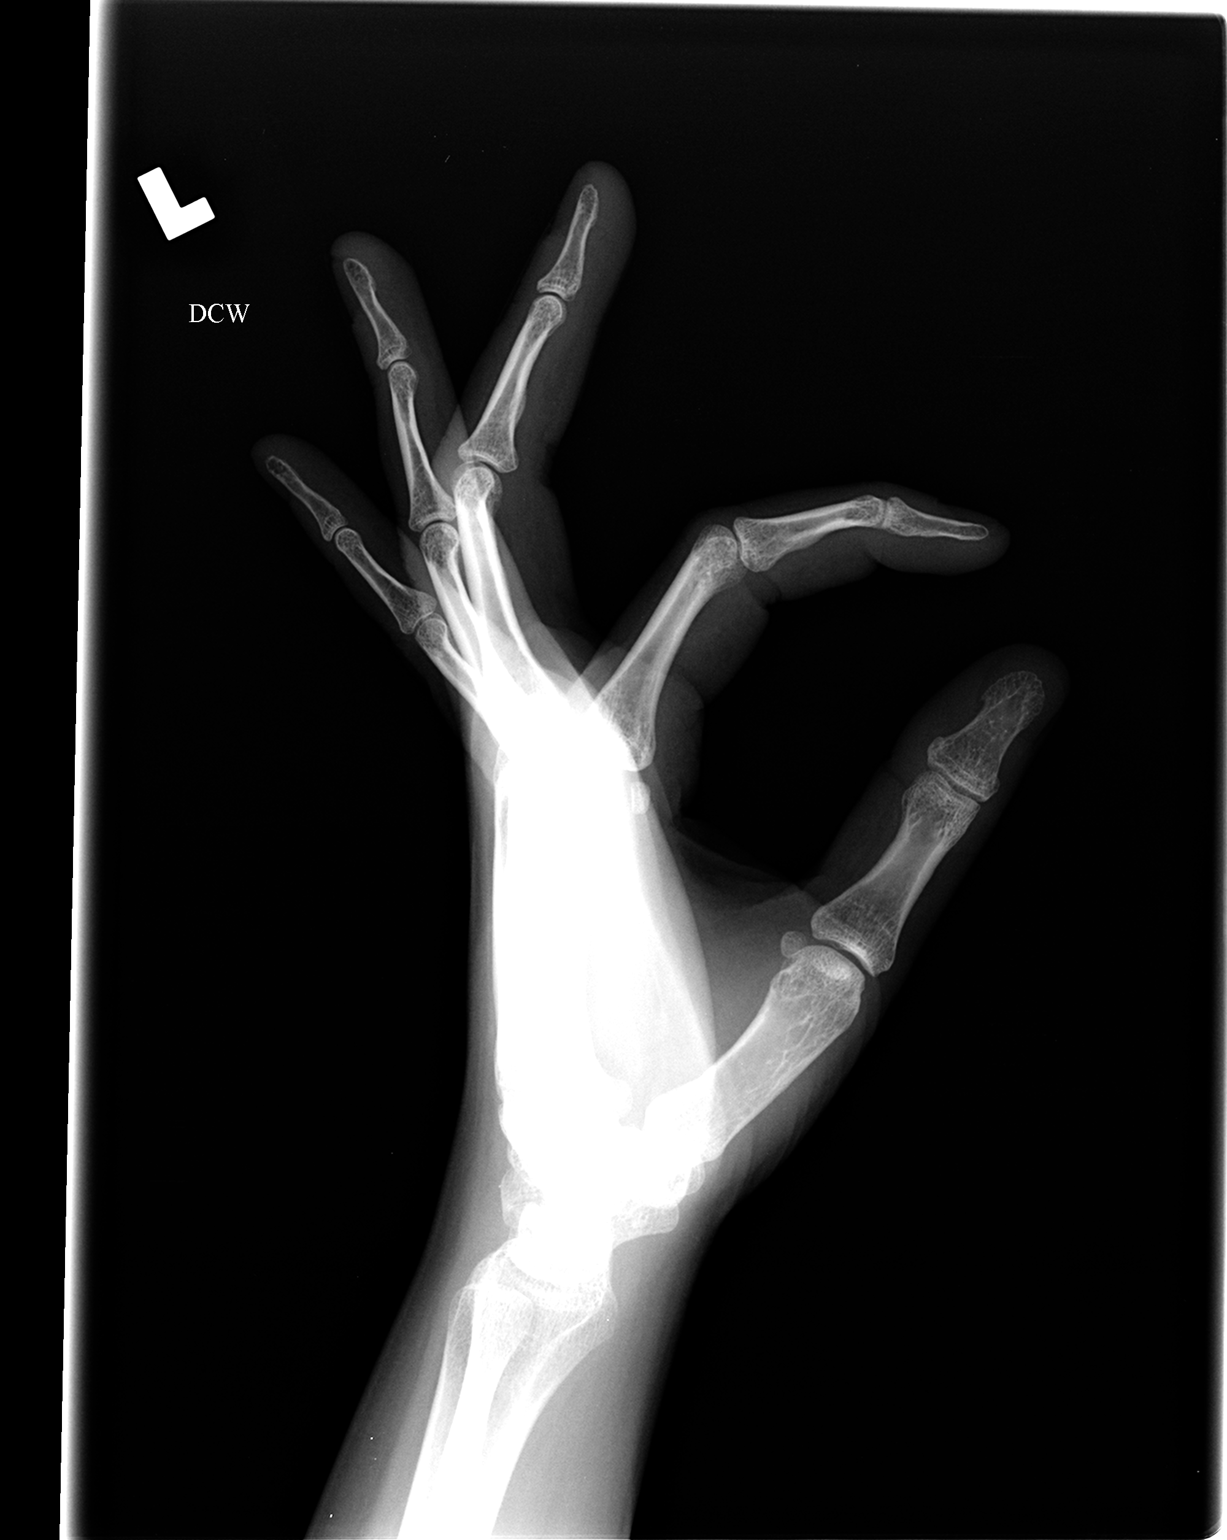

[3 of 3 positions shown; findings below may reference images not displayed]

FINDINGS: There is no evidence of fracture or dislocation. There is no
evidence of arthropathy or other focal bone abnormality. Soft
tissues are unremarkable.
IMPRESSION: No acute abnormality is noted.

## 2017-01-18 ENCOUNTER — Encounter (HOSPITAL_COMMUNITY): Payer: Self-pay | Admitting: *Deleted

## 2017-01-18 ENCOUNTER — Emergency Department (HOSPITAL_COMMUNITY)
Admission: EM | Admit: 2017-01-18 | Discharge: 2017-01-19 | Discharge status: Against Medical Advice | Payer: Self-pay | Attending: Emergency Medicine | Admitting: Emergency Medicine

## 2017-01-18 DIAGNOSIS — R1084 Generalized abdominal pain: Secondary | ICD-10-CM | POA: Insufficient documentation

## 2017-01-18 DIAGNOSIS — R109 Unspecified abdominal pain: Secondary | ICD-10-CM

## 2017-01-18 DIAGNOSIS — Z87891 Personal history of nicotine dependence: Secondary | ICD-10-CM | POA: Insufficient documentation

## 2017-01-18 LAB — URINALYSIS, ROUTINE W REFLEX MICROSCOPIC
BILIRUBIN URINE: NEGATIVE
Glucose, UA: NEGATIVE mg/dL
HGB URINE DIPSTICK: NEGATIVE
Ketones, ur: NEGATIVE mg/dL
Leukocytes, UA: NEGATIVE
Nitrite: NEGATIVE
PH: 5 (ref 5.0–8.0)
Protein, ur: NEGATIVE mg/dL
Specific Gravity, Urine: 1.006 (ref 1.005–1.030)

## 2017-01-18 LAB — PREGNANCY, URINE: Preg Test, Ur: NEGATIVE

## 2017-01-18 MED ORDER — PHENAZOPYRIDINE HCL 100 MG PO TABS
200.0000 mg | ORAL_TABLET | Freq: Once | ORAL | Status: AC
  Administered 2017-01-18: 200 mg via ORAL
  Filled 2017-01-18: qty 2

## 2017-01-18 MED ORDER — ONDANSETRON 8 MG PO TBDP
8.0000 mg | ORAL_TABLET | Freq: Once | ORAL | Status: AC
  Administered 2017-01-18: 8 mg via ORAL
  Filled 2017-01-18: qty 1

## 2017-01-18 NOTE — ED Triage Notes (Signed)
Pt with abd cramping for past 3 days, nausea about 3 days ago but denies at present.  Denies vomiting or diarrhea
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
## 2017-01-18 NOTE — ED Notes (Signed)
ED Provider at bedside. 

## 2017-01-19 ENCOUNTER — Emergency Department (HOSPITAL_COMMUNITY): Payer: Self-pay

## 2017-01-19 MED ORDER — DICYCLOMINE HCL 10 MG/ML IM SOLN
20.0000 mg | Freq: Once | INTRAMUSCULAR | Status: DC
  Filled 2017-01-19: qty 2

## 2017-01-19 MED ORDER — KETOROLAC TROMETHAMINE 30 MG/ML IJ SOLN
30.0000 mg | Freq: Once | INTRAMUSCULAR | Status: AC
  Administered 2017-01-19: 30 mg via INTRAMUSCULAR
  Filled 2017-01-19: qty 1

## 2017-01-19 NOTE — ED Notes (Signed)
ED Provider at bedside. 

## 2017-01-19 NOTE — ED Notes (Signed)
Writer went into patients room, patient was noted to be crying sitting on stretcher. Patient stated to writer she had to leave her kids have been with a Arts administratorbaby sitter since 0000. Writer offered to get EDP to speak with patient, patient refused. Patient had steady gait out of department with visitor.

## 2017-01-19 NOTE — ED Notes (Signed)
To Ct 

## 2019-08-30 ENCOUNTER — Emergency Department (HOSPITAL_COMMUNITY)
Admission: EM | Admit: 2019-08-30 | Discharge: 2019-08-31 | Disposition: A | Discharge status: Home / Self Care | Payer: Self-pay | Attending: Emergency Medicine | Admitting: Emergency Medicine

## 2019-08-30 ENCOUNTER — Other Ambulatory Visit: Payer: Self-pay

## 2019-08-30 ENCOUNTER — Encounter (HOSPITAL_COMMUNITY): Payer: Self-pay | Admitting: Emergency Medicine

## 2019-08-30 DIAGNOSIS — F319 Bipolar disorder, unspecified: Secondary | ICD-10-CM | POA: Insufficient documentation

## 2019-08-30 DIAGNOSIS — R519 Headache, unspecified: Secondary | ICD-10-CM | POA: Insufficient documentation

## 2019-08-30 DIAGNOSIS — F411 Generalized anxiety disorder: Secondary | ICD-10-CM | POA: Insufficient documentation

## 2019-08-30 DIAGNOSIS — F332 Major depressive disorder, recurrent severe without psychotic features: Secondary | ICD-10-CM | POA: Insufficient documentation

## 2019-08-30 DIAGNOSIS — Z20822 Contact with and (suspected) exposure to covid-19: Secondary | ICD-10-CM | POA: Insufficient documentation

## 2019-08-30 DIAGNOSIS — R45851 Suicidal ideations: Secondary | ICD-10-CM | POA: Insufficient documentation

## 2019-08-30 DIAGNOSIS — Z87891 Personal history of nicotine dependence: Secondary | ICD-10-CM | POA: Insufficient documentation

## 2019-08-30 LAB — CBC WITH DIFFERENTIAL/PLATELET
Abs Immature Granulocytes: 0.01 10*3/uL (ref 0.00–0.07)
Basophils Absolute: 0.1 10*3/uL (ref 0.0–0.1)
Basophils Relative: 1 %
Eosinophils Absolute: 0.3 10*3/uL (ref 0.0–0.5)
Eosinophils Relative: 4 %
HCT: 41.2 % (ref 36.0–46.0)
Hemoglobin: 13.6 g/dL (ref 12.0–15.0)
Immature Granulocytes: 0 %
Lymphocytes Relative: 30 %
Lymphs Abs: 2 10*3/uL (ref 0.7–4.0)
MCH: 32 pg (ref 26.0–34.0)
MCHC: 33 g/dL (ref 30.0–36.0)
MCV: 96.9 fL (ref 80.0–100.0)
Monocytes Absolute: 0.5 10*3/uL (ref 0.1–1.0)
Monocytes Relative: 7 %
Neutro Abs: 3.8 10*3/uL (ref 1.7–7.7)
Neutrophils Relative %: 58 %
Platelets: 198 10*3/uL (ref 150–400)
RBC: 4.25 MIL/uL (ref 3.87–5.11)
RDW: 12.2 % (ref 11.5–15.5)
WBC: 6.7 10*3/uL (ref 4.0–10.5)
nRBC: 0 % (ref 0.0–0.2)

## 2019-08-30 LAB — COMPREHENSIVE METABOLIC PANEL
ALT: 11 U/L (ref 0–44)
AST: 15 U/L (ref 15–41)
Albumin: 3.8 g/dL (ref 3.5–5.0)
Alkaline Phosphatase: 85 U/L (ref 38–126)
Anion gap: 7 (ref 5–15)
BUN: 13 mg/dL (ref 6–20)
CO2: 24 mmol/L (ref 22–32)
Calcium: 9 mg/dL (ref 8.9–10.3)
Chloride: 109 mmol/L (ref 98–111)
Creatinine, Ser: 0.69 mg/dL (ref 0.44–1.00)
GFR calc Af Amer: >60 mL/min (ref >60)
GFR calc non Af Amer: >60 mL/min (ref >60)
Glucose, Bld: 128 mg/dL — ABNORMAL HIGH (ref 70–99)
Potassium: 3.5 mmol/L (ref 3.5–5.1)
Sodium: 140 mmol/L (ref 135–145)
Total Bilirubin: 0.6 mg/dL (ref 0.3–1.2)
Total Protein: 6.8 g/dL (ref 6.5–8.1)

## 2019-08-30 LAB — URINALYSIS, ROUTINE W REFLEX MICROSCOPIC
Bacteria, UA: NONE SEEN
Bilirubin Urine: NEGATIVE
Glucose, UA: NEGATIVE mg/dL
Hgb urine dipstick: NEGATIVE
Ketones, ur: NEGATIVE mg/dL
Nitrite: NEGATIVE
Protein, ur: 30 mg/dL — AB
Specific Gravity, Urine: 1.03 (ref 1.005–1.030)
pH: 5 (ref 5.0–8.0)

## 2019-08-30 LAB — POC URINE PREG, ED: Preg Test, Ur: NEGATIVE

## 2019-08-30 LAB — ETHANOL: Alcohol, Ethyl (B): <10 mg/dL (ref <10)

## 2019-08-30 LAB — RAPID URINE DRUG SCREEN, HOSP PERFORMED
Amphetamines: POSITIVE — AB
Barbiturates: NOT DETECTED
Benzodiazepines: NOT DETECTED
Cocaine: POSITIVE — AB
Opiates: NOT DETECTED
Tetrahydrocannabinol: NOT DETECTED

## 2019-08-30 LAB — ACETAMINOPHEN LEVEL: Acetaminophen (Tylenol), Serum: <10 ug/mL — ABNORMAL LOW (ref 10–30)

## 2019-08-30 LAB — SALICYLATE LEVEL: Salicylate Lvl: <7 mg/dL — ABNORMAL LOW (ref 7.0–30.0)

## 2019-08-30 NOTE — ED Notes (Signed)
Pts grandmother updated on care per approval by pt.
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
## 2019-08-30 NOTE — ED Notes (Signed)
IVC & first exam paperwork faxed to [facility]

## 2019-08-30 NOTE — ED Notes (Signed)
Pt assisted to bathroom to change into wine scrubs, security & law enforcement at bedside.

## 2019-08-30 NOTE — ED Notes (Signed)
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 Pt tearful when telling me this.

## 2019-08-30 NOTE — ED Notes (Signed)
Pt wanded after changing into wine scrubs, law enforcement signed off, pt calm & cooperative at this time. Pt given meal tray. Sitter at bedside
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
## 2019-08-31 ENCOUNTER — Encounter: Payer: Self-pay | Admitting: Psychiatry

## 2019-08-31 ENCOUNTER — Inpatient Hospital Stay
Admission: AD | Admit: 2019-08-31 | Discharge: 2019-09-01 | DRG: 885 | Disposition: A | Discharge status: Home / Self Care | Payer: No Typology Code available for payment source | Source: Intra-hospital | Attending: Psychiatry | Admitting: Psychiatry

## 2019-08-31 DIAGNOSIS — F1410 Cocaine abuse, uncomplicated: Secondary | ICD-10-CM | POA: Diagnosis present

## 2019-08-31 DIAGNOSIS — Z87891 Personal history of nicotine dependence: Secondary | ICD-10-CM | POA: Diagnosis not present

## 2019-08-31 DIAGNOSIS — F1510 Other stimulant abuse, uncomplicated: Secondary | ICD-10-CM | POA: Diagnosis present

## 2019-08-31 DIAGNOSIS — Z20822 Contact with and (suspected) exposure to covid-19: Secondary | ICD-10-CM | POA: Diagnosis present

## 2019-08-31 DIAGNOSIS — G4700 Insomnia, unspecified: Secondary | ICD-10-CM | POA: Diagnosis present

## 2019-08-31 DIAGNOSIS — Z818 Family history of other mental and behavioral disorders: Secondary | ICD-10-CM | POA: Diagnosis not present

## 2019-08-31 DIAGNOSIS — N390 Urinary tract infection, site not specified: Secondary | ICD-10-CM

## 2019-08-31 DIAGNOSIS — F332 Major depressive disorder, recurrent severe without psychotic features: Principal | ICD-10-CM | POA: Diagnosis present

## 2019-08-31 DIAGNOSIS — F333 Major depressive disorder, recurrent, severe with psychotic symptoms: Secondary | ICD-10-CM | POA: Diagnosis present

## 2019-08-31 LAB — RESPIRATORY PANEL BY RT PCR (FLU A&B, COVID)
Influenza A by PCR: NEGATIVE
Influenza B by PCR: NEGATIVE
SARS Coronavirus 2 by RT PCR: NEGATIVE

## 2019-08-31 LAB — POC URINE PREG, ED: Preg Test, Ur: NEGATIVE

## 2019-08-31 MED ORDER — ACETAMINOPHEN 325 MG PO TABS: 650.0000 mg | ORAL_TABLET | Freq: Four times a day (QID) | ORAL | Status: DC | PRN

## 2019-08-31 MED ORDER — TRAZODONE HCL 100 MG PO TABS
100.0000 mg | ORAL_TABLET | Freq: Every evening | ORAL | Status: DC | PRN
  Administered 2019-08-31: 100 mg via ORAL
  Filled 2019-08-31: qty 1

## 2019-08-31 MED ORDER — SULFAMETHOXAZOLE-TRIMETHOPRIM 800-160 MG PO TABS
1.0000 | ORAL_TABLET | Freq: Two times a day (BID) | ORAL | Status: DC
  Administered 2019-08-31 – 2019-09-01 (×2): 1 via ORAL
  Filled 2019-08-31 (×2): qty 1

## 2019-08-31 MED ORDER — HYDROXYZINE HCL 25 MG PO TABS
25.0000 mg | ORAL_TABLET | Freq: Three times a day (TID) | ORAL | Status: DC | PRN
  Administered 2019-08-31: 21:00:00 25 mg via ORAL
  Filled 2019-08-31: qty 1

## 2019-08-31 MED ORDER — FLUOXETINE HCL 20 MG PO CAPS
20.0000 mg | ORAL_CAPSULE | Freq: Every day | ORAL | Status: DC
  Administered 2019-08-31 – 2019-09-01 (×2): 20 mg via ORAL
  Filled 2019-08-31 (×2): qty 1

## 2019-08-31 MED ORDER — ALUM & MAG HYDROXIDE-SIMETH 200-200-20 MG/5ML PO SUSP: 30.0000 mL | ORAL | Status: DC | PRN

## 2019-08-31 MED ORDER — MAGNESIUM HYDROXIDE 400 MG/5ML PO SUSP: 30.0000 mL | Freq: Every day | ORAL | Status: DC | PRN

## 2019-08-31 NOTE — [facility] Group Notes (Signed)
  LCSW Group Therapy Note  08/31/2019 11:45 AM   Type of Therapy/Topic:  Group Therapy:  Feelings about Diagnosis  Participation Level:  Did Not Attend   Description of Group:   This group will allow patients to explore their thoughts and feelings about diagnoses they have received. Patients will be guided to explore their level of understanding and acceptance of these diagnoses. Facilitator will encourage patients to process their thoughts and feelings about the reactions of others to their diagnosis and will guide patients in identifying ways to discuss their diagnosis with significant others in their lives. This group will be process-oriented, with patients participating in exploration of their own experiences, giving and receiving support, and processing challenge from other group members.   Therapeutic Goals: 1. Patient will demonstrate understanding of diagnosis as evidenced by identifying two or more symptoms of the disorder 2. Patient will be able to express two feelings regarding the diagnosis 3. Patient will demonstrate their ability to communicate their needs through discussion and/or role play  Summary of Patient Progress: x   Therapeutic Modalities:   Cognitive Behavioral Therapy Brief Therapy Feelings Identification    Iris Pert, MSW, LCSW Clinical Social Work 08/31/2019 11:45 AM

## 2019-08-31 NOTE — Tx Team (Signed)
Initial Treatment Plan 2019-09-03 4:12 PM [name] [id number]    PATIENT STRESSORS: Financial difficulties Legal issue Substance abuse  Grief  Mother Death    PATIENT STRENGTHS: Ability for insight Active sense of humor Average or above average intelligence Capable of independent living Communication skills   PATIENT IDENTIFIED PROBLEMS: Depression  03-Sep-2019   No Job / No  Money 09-03-2019  Kids In  [place] Care 09-03-2019   Mother Death 2022/08/22)  09-03-2019  Substance Abuse 03-Sep-2019             DISCHARGE CRITERIA:  Ability to meet basic life and health needs Adequate post-discharge living arrangements Improved stabilization in mood, thinking, and/or behavior  PRELIMINARY DISCHARGE PLAN: Outpatient therapy Return to previous living arrangement  PATIENT/FAMILY INVOLVEMENT: This treatment plan has been presented to and reviewed with the patient, Linda Jackson, and/or family member, .  The patient and family have been given the opportunity to ask questions and make suggestions.  Crist Infante, RN Sep 03, 2019, 4:12 PM

## 2019-08-31 NOTE — ED Provider Notes (Signed)
Emergency Medicine Observation Re-evaluation Note  Linda Jackson is a 29 y.o. female, seen on rounds today.  Pt initially presented to the ED for complaints of V70.1 Currently, the patient is eating breakfast, resting comfortably.  Physical Exam  BP 108/71 (BP Location: Right Arm)   Pulse 74   Temp (!) 97.4 F (36.3 C) (Oral)   Resp 20   Ht 5\' 1"  (1.549 m)   Wt 63 kg   SpO2 97%   BMI 26.26 kg/m  Physical Exam  ED Course / MDM  EKG:    I have reviewed the labs performed to date as well as medications administered while in observation.  Recent changes in the last 24 hours include none. Plan  Current plan is for psychiatric admission. Patient is under full IVC at this time.  Patient has been accepted to [facility] by Dr. [facility] and NP Linda Jackson.   Linda Morn, MD 08/31/19 [phone]

## 2019-08-31 NOTE — [facility] Group Notes (Signed)
[facility] Group Notes:  (Nursing/MHT/Case Management/Adjunct)  Date:  08/31/2019  Time:  9:07 PM  Type of Therapy:  Group Therapy  Participation Level:  Active  Participation Quality:  Appropriate  Affect:  Appropriate  Cognitive:  Appropriate  Insight:  Appropriate  Engagement in Group:  Engaged  Modes of Intervention:  Discussion  Summary of Progress/Problems:  Linda Jackson 08/31/2019, 9:07 PM
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
## 2019-08-31 NOTE — Progress Notes (Signed)
Patient was in the dayroom upon arrival. Patient pleasant during assessment denying SI/HI/AVH and pain. Patient endorses anxiety and depression rating them 7/10 stating it was from the recent death of her mother. Patient given support and encouragement to be active in her treatment plan. Patient given education. Patient being monitored Q 15 minutes for safety per unit protocol. Patient remains safe on the unit.
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
## 2019-08-31 NOTE — ED Notes (Signed)
Pt can be admitted after shift change.  Report can be called to: [phone]  Attending: Dr. [name] Accepting: Elenore Paddy, NP  Room 301

## 2019-08-31 NOTE — [facility] Counselor (Signed)
Clinician spoke to [place], RN the pt needs COVID test.    Per Hassie Bruce, RN, Vita Barley from [facility] will look at pt for possible inpatient admission however COVID test is needed.     Redmond Pulling, MS, [facility], [facility] Triage Specialist [phone]

## 2019-08-31 NOTE — Plan of Care (Signed)
New admission . Patient has not  started  processing information as yet .Patient instructed on  [facility] Education, able to verbalize information received .    Problem: Education: Goal: Knowledge of [facility] Education information/materials will improve Outcome: Progressing   Problem: Coping: Goal: Ability to verbalize frustrations and anger appropriately will improve Outcome: Not Progressing Goal: Ability to demonstrate self-control will improve Outcome: Not Progressing   Problem: Safety: Goal: Periods of time without injury will increase Outcome: Not Progressing   Problem: Education: Goal: Utilization of techniques to improve thought processes will improve Outcome: Not Progressing Goal: Knowledge of the prescribed therapeutic regimen will improve Outcome: Not Progressing   Problem: Medication: Goal: Compliance with prescribed medication regimen will improve Outcome: Not Progressing   Problem: Health Behavior/Discharge Planning: Goal: Ability to identify changes in lifestyle to reduce recurrence of condition will improve Outcome: Not Progressing Goal: Identification of resources available to assist in meeting health care needs will improve Outcome: Not Progressing   Problem: Education: Goal: Verbalization of understanding the information provided will improve Outcome: Not Progressing   Problem: Coping: Goal: Ability to identify and utilize appropriate coping strategies will improve Outcome: Not Progressing

## 2019-08-31 NOTE — [facility] Counselor (Signed)
Clinician attempted to contact pt's IVC petitioner [facility] [place] Flakes, attorney [phone]) to gather additional information however no answer (office number was provided).     Redmond Pulling, MS, [place], [facility] Triage Specialist [phone]
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
## 2019-08-31 NOTE — Plan of Care (Signed)
Patient new to the unit today, hasn't had time to progress  Problem: Education: Goal: Knowledge of [place] [facility] Education information/materials will improve Outcome: Not Progressing   Problem: Coping: Goal: Ability to verbalize frustrations and anger appropriately will improve Outcome: Not Progressing Goal: Ability to demonstrate self-control will improve Outcome: Not Progressing   Problem: Safety: Goal: Periods of time without injury will increase Outcome: Not Progressing   Problem: Education: Goal: Utilization of techniques to improve thought processes will improve Outcome: Not Progressing Goal: Knowledge of the prescribed therapeutic regimen will improve Outcome: Not Progressing
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
## 2019-08-31 NOTE — BHH Counselor (Signed)
Linda Bruce, RN pt has been accepted to [facility], Deanna Artis at [facility] to call [facility] with accepting information.    Redmond Pulling, MS, [facility], [facility] Triage Specialist [phone]

## 2019-09-01 LAB — LIPID PANEL
Cholesterol: 142 mg/dL (ref 0–200)
HDL: 41 mg/dL (ref >40)
LDL Cholesterol: 81 mg/dL (ref 0–99)
Total CHOL/HDL Ratio: 3.5 RATIO
Triglycerides: 100 mg/dL (ref <150)
VLDL: 20 mg/dL (ref 0–40)

## 2019-09-01 LAB — URINE CULTURE: Culture: <10000 — AB

## 2019-09-01 LAB — HEMOGLOBIN A1C
Hgb A1c MFr Bld: 5.1 % (ref 4.8–5.6)
Mean Plasma Glucose: 99.67 mg/dL

## 2019-09-01 LAB — TSH: TSH: 3.309 u[IU]/mL (ref 0.350–4.500)

## 2019-09-01 MED ORDER — TRAZODONE HCL 100 MG PO TABS: 100.0000 mg | ORAL_TABLET | Freq: Every evening | ORAL | 1 refills | Status: DC | PRN

## 2019-09-01 MED ORDER — FLUOXETINE HCL 20 MG PO CAPS: 20.0000 mg | ORAL_CAPSULE | Freq: Every day | ORAL | 0 refills | Status: DC

## 2019-09-01 MED ORDER — HYDROXYZINE HCL 25 MG PO TABS: 25.0000 mg | ORAL_TABLET | Freq: Three times a day (TID) | ORAL | 1 refills | Status: DC | PRN

## 2019-09-01 MED ORDER — HYDROXYZINE HCL 25 MG PO TABS: 25.0000 mg | ORAL_TABLET | Freq: Three times a day (TID) | ORAL | 0 refills | Status: DC | PRN

## 2019-09-01 MED ORDER — SULFAMETHOXAZOLE-TRIMETHOPRIM 800-160 MG PO TABS: 1.0000 | ORAL_TABLET | Freq: Two times a day (BID) | ORAL | 0 refills | Status: DC

## 2019-09-01 MED ORDER — TRAZODONE HCL 100 MG PO TABS: 100.0000 mg | ORAL_TABLET | Freq: Every evening | ORAL | 0 refills | Status: DC | PRN

## 2019-09-01 MED ORDER — FLUOXETINE HCL 20 MG PO CAPS: 20.0000 mg | ORAL_CAPSULE | Freq: Every day | ORAL | 1 refills | Status: DC

## 2019-09-01 NOTE — Progress Notes (Signed)
  [facility] Adult Case Management Discharge Plan :  Will you be returning to the same living situation after discharge:  Yes,  home At discharge, do you have transportation home?: Yes,  safe transport Do you have the ability to pay for your medications: Yes,  mental health  Release of information consent forms completed and in the chart;    Patient to Follow up at: Follow-up Information    [facility] Follow up on 09/02/2019.   Why: You have an appointment scheduled for Thursday 09/02/19 at 9:30 AM. Please take a photo id, discharge paperwork, and any insurance information. Thank You! Contact information: [address] [place] [address] [phone]           Next level of care provider has access to [facility] Link:no  Safety Planning and Suicide Prevention discussed: Yes,  SPE completed with pt as pt declined collateral contact  Have you used any form of tobacco in the last 30 days? (Cigarettes, Smokeless Tobacco, Cigars, and/or Pipes): Yes  Has patient been referred to the Quitline?: Patient refused referral  Patient has been referred for addiction treatment: Pt. refused referral  Mechele Dawley, LCSW 09/01/2019, 9:12 AM

## 2019-09-01 NOTE — BHH Suicide Risk Assessment (Signed)
[facility] INPATIENT:  Family/Significant Other Suicide Prevention Education  Suicide Prevention Education:  Patient Refusal for Family/Significant Other Suicide Prevention Education: The [name] has refused to provide written consent for family/significant other to be provided Family/Significant Other Suicide Prevention Education during admission and/or prior to discharge.  Physician notified.  SPE completed with pt, as pt refused to consent to family contact. SPI pamphlet provided to pt and pt was encouraged to share information with support network, ask questions, and talk about any concerns relating to SPE. Pt denies access to guns/firearms and verbalized understanding of information provided. Mobile Crisis information also provided to pt.    Linda Jackson MSW LCSW 09/01/2019, 9:09 AM

## 2019-09-01 NOTE — Discharge Summary (Signed)
Physician Discharge Summary Note  Patient:  Linda Jackson is an 29 y.o., female MRN:  [id number] DOB:  05-20-1991 Patient phone:  [phone] (home)  Patient address:   [address] [place] [address],  Total Time spent with patient: 30 minutes  Date of Admission:  08/31/2019 Date of Discharge: 09/01/19  Reason for Admission:  This is a 29 year old woman who is brought in by IVC that was filed by her Engineer, drilling and Materials engineer because she made a comment in front of them that "signing my children away I might as well blow my brains out".   Principal Problem: MDD (major depressive disorder), recurrent severe, without psychosis (HCC) Discharge Diagnoses: Principal Problem:   MDD (major depressive disorder), recurrent severe, without psychosis (HCC) Active Problems:   Cocaine abuse (HCC)   Amphetamine abuse (HCC)   Urinary tract infection   Past Psychiatric History: Patient has been treated with antidepressant medicine in the past.  She was taking Effexor with good result a couple of years ago until she lost insurance the last time.  She denies ever having tried to kill her self in the past.  Denies any previous hospitalizations.  Past Medical History:  Past Medical History:  Diagnosis Date  . Bipolar 1 disorder (HCC)   . Chronic back pain   . Depression     Past Surgical History:  Procedure Laterality Date  . CHOLECYSTECTOMY    . TONSILLECTOMY    . TYMPANOSTOMY TUBE PLACEMENT  1997   Family History:  Family History  Problem Relation Age of Onset  . Depression Mother   . Kidney disease Mother   . Stroke Mother   . ADD / ADHD Brother   . COPD Maternal Grandmother   . Cancer Maternal Grandfather        brain  . Diabetes Paternal Grandfather   . Hypertension Paternal Grandfather    Family Psychiatric  History: Positive for depression Social History:  Social History   Substance and Sexual Activity  Alcohol Use No     Social History    Substance and Sexual Activity  Drug Use No  . Types: Marijuana    Social History   Socioeconomic History  . Marital status: Married    Spouse name: Not on file  . Number of children: Not on file  . Years of education: Not on file  . Highest education level: Not on file  Occupational History  . Not on file  Tobacco Use  . Smoking status: Former Games developer  . Smokeless tobacco: Never Used  Substance and Sexual Activity  . Alcohol use: No  . Drug use: No    Types: Marijuana  . Sexual activity: Not Currently    Birth control/protection: None, Condom  Other Topics Concern  . Not on file  Social History Narrative  . Not on file   Social Determinants of Health   Financial Resource Strain:   . Difficulty of Paying Living Expenses: Not on file  Food Insecurity:   . Worried About Programme researcher, broadcasting/film/video in the Last Year: Not on file  . Ran Out of Food in the Last Year: Not on file  Transportation Needs:   . Lack of Transportation (Medical): Not on file  . Lack of Transportation (Non-Medical): Not on file  Physical Activity:   . Days of Exercise per Week: Not on file  . Minutes of Exercise per Session: Not on file  Stress:   . Feeling of Stress :  Not on file  Social Connections:   . Frequency of Communication with Friends and Family: Not on file  . Frequency of Social Gatherings with Friends and Family: Not on file  . Attends Religious Services: Not on file  . Active Member of Clubs or Organizations: Not on file  . Attends Banker Meetings: Not on file  . Marital Status: Not on file    Hospital Course:  Patient remained on the [facility] unit for 1 days. The patient stabilized on medication and therapy. Patient was discharged on Prozac 20 mg Daily, Vistaril 25 mg TID PRN, Trazodone 100 mg QHS PRN, and Bactrim DS 800-160 Q12h for 5 days. Patient has shown improvement with improved mood, affect, sleep, appetite, and interaction. Patient has attended group and participated.  Patient has been seen in the day room interacting with peers and staff appropriately. Patient denies any SI/HI/AVH and contracts for safety. Patient agrees to follow up at [facility]. Patient is provided with prescriptions for their medications upon discharge.  Physical Findings: AIMS:  , ,  ,  ,    CIWA:    COWS:     Musculoskeletal: Strength & Muscle Tone: within normal limits Gait & Station: normal Patient leans: N/A  Psychiatric Specialty Exam: Physical Exam  Nursing note and vitals reviewed. Constitutional: She is oriented to person, place, and time. She appears well-developed and well-nourished.  Cardiovascular: Normal rate.  Respiratory: Effort normal.  Musculoskeletal:        General: Normal range of motion.  Neurological: She is alert and oriented to person, place, and time.  Skin: Skin is warm.    Review of Systems  Constitutional: Negative.   HENT: Negative.   Eyes: Negative.   Respiratory: Negative.   Cardiovascular: Negative.   Gastrointestinal: Negative.   Genitourinary: Negative.   Musculoskeletal: Negative.   Skin: Negative.   Neurological: Negative.   Psychiatric/Behavioral: Negative.     Blood pressure 107/64, pulse 81, temperature 98.6 F (37 C), temperature source Oral, resp. rate 18, height 5' (1.524 m), weight 59.9 kg, SpO2 100 %.Body mass index is 25.78 kg/m.   General Appearance: Casual  Eye Contact::  Fair  Speech:  Clear and Coherent409  Volume:  Normal  Mood:  Euthymic  Affect:  Congruent  Thought Process:  Goal Directed  Orientation:  Full (Time, Place, and Person)  Thought Content:  Logical  Suicidal Thoughts:  No  Homicidal Thoughts:  No  Memory:  Immediate;   Fair Recent;   Fair Remote;   Fair  Judgement:  Fair  Insight:  Fair  Psychomotor Activity:  Normal  Concentration:  Fair  Recall:  [employer] of Knowledge:Fair  Language: Fair  Akathisia:  No  Handed:  Right  AIMS (if indicated):     Assets:   Communication Skills Desire for Improvement Physical Health Resilience Social Support  Sleep:  Number of Hours: 8  Cognition: WNL  ADL's:  Intact   Have you used any form of tobacco in the last 30 days? (Cigarettes, Smokeless Tobacco, Cigars, and/or Pipes): Yes  Has this patient used any form of tobacco in the last 30 days? (Cigarettes, Smokeless Tobacco, Cigars, and/or Pipes) Yes, Yes, A prescription for an FDA-approved tobacco cessation medication was offered at discharge and the patient refused  Blood Alcohol level:  Lab Results  Component Value Date   ETH <10 08/30/2019    Metabolic Disorder Labs:  No results found for: HGBA1C, MPG No results found for: PROLACTIN Lab  Results  Component Value Date   CHOL 142 09/01/2019   TRIG 100 09/01/2019   HDL 41 09/01/2019   CHOLHDL 3.5 09/01/2019   VLDL 20 09/01/2019   LDLCALC 81 09/01/2019    See Psychiatric Specialty Exam and Suicide Risk Assessment completed by Attending Physician prior to discharge.  Discharge destination:  Home  Is patient on multiple antipsychotic therapies at discharge:  No   Has Patient had three or more failed trials of antipsychotic monotherapy by history:  No  Recommended Plan for Multiple Antipsychotic Therapies: NA   Allergies as of 09/01/2019      Reactions   Morphine And Related Itching   "tried to claw my eyes out"   Cefzil [cefprozil] Hives   Lexapro [escitalopram Oxalate] Nausea And Vomiting      Medication List    STOP taking these medications   acetaminophen 500 MG tablet Commonly known as: TYLENOL     TAKE these medications     Indication  FLUoxetine 20 MG capsule Commonly known as: PROZAC Take 1 capsule (20 mg total) by mouth daily. Start taking on: September 02, 2019  Indication: Depression   hydrOXYzine 25 MG tablet Commonly known as: ATARAX/VISTARIL Take 1 tablet (25 mg total) by mouth 3 (three) times daily as needed for anxiety.  Indication: Feeling Anxious    sulfamethoxazole-trimethoprim 800-160 MG tablet Commonly known as: BACTRIM DS Take 1 tablet by mouth every 12 (twelve) hours.  Indication: Urinary Tract Infection   traZODone 100 MG tablet Commonly known as: DESYREL Take 1 tablet (100 mg total) by mouth at bedtime as needed for sleep.  Indication: Trouble Sleeping      Follow-up Information    [facility] Follow up on 09/02/2019.   Why: You have an appointment scheduled for Thursday 09/02/19 at 9:30 AM. Please take a photo id, discharge paperwork, and any insurance information. Thank You! Contact information: [place] [address] [phone]           Follow-up recommendations:  Continue activity as tolerated. Continue diet as recommended by your PCP. Ensure to keep all appointments with outpatient providers.  Comments:  Patient is instructed prior to discharge to: Take all medications as prescribed by his/her mental healthcare provider. Report any adverse effects and or reactions from the medicines to his/her outpatient provider promptly. Patient has been instructed & cautioned: To not engage in alcohol and or illegal drug use while on prescription medicines. In the event of worsening symptoms, patient is instructed to call the crisis hotline, 911 and or go to the nearest ED for appropriate evaluation and treatment of symptoms. To follow-up with his/her primary care provider for your other medical issues, concerns and or health care needs.    Signed: Lowry Ram Charlot Gouin, FNP 09/01/2019, 9:11 AM

## 2019-09-01 NOTE — BHH Counselor (Signed)
Adult Comprehensive Assessment  Patient ID: Linda Jackson, female   DOB: 04-29-1991, 29 y.o.   MRN: [id number]  Information Source:    Current Stressors:     Living/Environment/Situation:  Living Arrangements: Non-relatives/Friends  Family History:     Childhood History:     Education:     Employment/Work Situation:      Surveyor, quantity Resources:      Alcohol/Substance Abuse:      Social Support System:      Leisure/Recreation:      Strengths/Needs:      Discharge Plan:      Summary/Recommendations:   Emergency planning/management officer and Recommendations (to be completed by the evaluator): Pt is a 29 yo female living [facility] [place] [place] [facility] Pt presents to the hospital seeking treatment for SI, depression, and anxiety. Pt has a diagnosis of MDD, recurrent, severe without psychosis. Pt is scheduled to be discharged today 09/01/2019 and is agreeable to a referral to [facility]. Pt denies SI/HI/AVH currently. Recommendations for pt include: crisis stabilization, therapeutic milieu, encourage group attendance and participation, medication management for mood stabilization, and development for comprehensive mental wellness plan. CSW assessing for appropriate referrals.  Charlann Lange Jefry Lesinski MSW LCSW 09/01/2019 9:12 AM
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
## 2019-09-01 NOTE — BHH Group Notes (Signed)
LCSW Group Therapy Note  09/01/2019 1:00 PM  Type of Therapy/Topic:  Group Therapy:  Emotion Regulation  Participation Level:  Did Not Attend   Description of Group:   The purpose of this group is to assist patients in learning to regulate negative emotions and experience positive emotions. Patients will be guided to discuss ways in which they have been vulnerable to their negative emotions. These vulnerabilities will be juxtaposed with experiences of positive emotions or situations, and patients will be challenged to use positive emotions to combat negative ones. Special emphasis will be placed on coping with negative emotions in conflict situations, and patients will process healthy conflict resolution skills.  Therapeutic Goals: 1. Patient will identify two positive emotions or experiences to reflect on in order to balance out negative emotions 2. Patient will label two or more emotions that they find the most difficult to experience 3. Patient will demonstrate positive conflict resolution skills through discussion and/or role plays  Summary of Patient Progress: X  Therapeutic Modalities:   Cognitive Behavioral Therapy Feelings Identification Dialectical Behavioral Therapy  Penni Homans, MSW, LCSW 09/01/2019 1:57 PM

## 2019-09-01 NOTE — Plan of Care (Signed)
  Problem: Education: Goal: Knowledge of [place] [facility] Education information/materials will improve Outcome: Adequate for Discharge   Problem: Coping: Goal: Ability to verbalize frustrations and anger appropriately will improve Outcome: Adequate for Discharge Goal: Ability to demonstrate self-control will improve Outcome: Adequate for Discharge   Problem: Safety: Goal: Periods of time without injury will increase Outcome: Adequate for Discharge   Problem: Education: Goal: Utilization of techniques to improve thought processes will improve Outcome: Adequate for Discharge Goal: Knowledge of the prescribed therapeutic regimen will improve Outcome: Adequate for Discharge   Problem: Medication: Goal: Compliance with prescribed medication regimen will improve Outcome: Adequate for Discharge   Problem: Health Behavior/Discharge Planning: Goal: Ability to identify changes in lifestyle to reduce recurrence of condition will improve Outcome: Adequate for Discharge Goal: Identification of resources available to assist in meeting health care needs will improve Outcome: Adequate for Discharge   Problem: Education: Goal: Verbalization of understanding the information provided will improve Outcome: Adequate for Discharge   Problem: Coping: Goal: Ability to identify and utilize appropriate coping strategies will improve Outcome: Adequate for Discharge

## 2019-09-01 NOTE — BHH Group Notes (Signed)
BHH Group Notes:  (Nursing/MHT/Case Management/Adjunct)  Date:  09/01/2019  Time:  8:49 AM  Type of Therapy:  Community Meeting   Participation Level:  Active  Participation Quality:  Appropriate and Attentive  Affect:  Appropriate  Cognitive:  Alert  Insight:  Appropriate and Good  Engagement in Group:  Supportive  Modes of Intervention:  Discussion and Orientation  Summary of Progress/Problems:  Linda Jackson 09/01/2019, 8:49 AM

## 2019-09-01 NOTE — Progress Notes (Signed)
Recreation Therapy Notes   Date: 09/01/2019  Time: 9:30 am   Location: Craft room   Behavioral response: N/A   Intervention Topic: Self-esteem   Discussion/Intervention: Patient did not attend group.   Clinical Observations/Feedback:  Patient did not attend group.   Treon Kehl LRT/CTRS        Anushree Dorsi 09/01/2019 12:35 PM 

## 2019-09-01 NOTE — Progress Notes (Signed)
D: Patient is aware of  Discharge this shift   A:.Patient denies suicidal /homicidal ideations. Patient received all belongings brought in  No Storage medications. Writer reviewed Discharge Summary, Suicide Risk Assessment, and Transitional Record. Patient also received Prescriptions   from  MD. A 7 day supply of medications given to patient  Aware  Of follow up appointment . R: Patient left unit with no questions  Or concerns  With Lawyer [employer]  [id number]

## 2020-05-24 ENCOUNTER — Emergency Department (HOSPITAL_COMMUNITY): Payer: Self-pay

## 2020-05-24 ENCOUNTER — Other Ambulatory Visit: Payer: Self-pay

## 2020-05-24 ENCOUNTER — Encounter (HOSPITAL_COMMUNITY): Payer: Self-pay

## 2020-05-24 ENCOUNTER — Inpatient Hospital Stay (HOSPITAL_COMMUNITY)
Admission: EM | Admit: 2020-05-24 | Discharge: 2020-05-30 | DRG: 956 | Disposition: A | Discharge status: Home / Self Care | Payer: Self-pay | Attending: Surgery | Admitting: Surgery

## 2020-05-24 DIAGNOSIS — S0003XA Contusion of scalp, initial encounter: Secondary | ICD-10-CM | POA: Diagnosis present

## 2020-05-24 DIAGNOSIS — S32592A Other specified fracture of left pubis, initial encounter for closed fracture: Secondary | ICD-10-CM | POA: Diagnosis present

## 2020-05-24 DIAGNOSIS — F1290 Cannabis use, unspecified, uncomplicated: Secondary | ICD-10-CM | POA: Diagnosis present

## 2020-05-24 DIAGNOSIS — T148XXA Other injury of unspecified body region, initial encounter: Secondary | ICD-10-CM

## 2020-05-24 DIAGNOSIS — Y9301 Activity, walking, marching and hiking: Secondary | ICD-10-CM | POA: Diagnosis present

## 2020-05-24 DIAGNOSIS — M549 Dorsalgia, unspecified: Secondary | ICD-10-CM | POA: Diagnosis present

## 2020-05-24 DIAGNOSIS — Z419 Encounter for procedure for purposes other than remedying health state, unspecified: Secondary | ICD-10-CM

## 2020-05-24 DIAGNOSIS — Z4682 Encounter for fitting and adjustment of non-vascular catheter: Secondary | ICD-10-CM

## 2020-05-24 DIAGNOSIS — Z975 Presence of (intrauterine) contraceptive device: Secondary | ICD-10-CM

## 2020-05-24 DIAGNOSIS — S32029A Unspecified fracture of second lumbar vertebra, initial encounter for closed fracture: Secondary | ICD-10-CM | POA: Diagnosis present

## 2020-05-24 DIAGNOSIS — F1910 Other psychoactive substance abuse, uncomplicated: Secondary | ICD-10-CM | POA: Diagnosis present

## 2020-05-24 DIAGNOSIS — S2242XA Multiple fractures of ribs, left side, initial encounter for closed fracture: Secondary | ICD-10-CM

## 2020-05-24 DIAGNOSIS — S36113A Laceration of liver, unspecified degree, initial encounter: Secondary | ICD-10-CM

## 2020-05-24 DIAGNOSIS — J939 Pneumothorax, unspecified: Secondary | ICD-10-CM | POA: Diagnosis present

## 2020-05-24 DIAGNOSIS — Z818 Family history of other mental and behavioral disorders: Secondary | ICD-10-CM

## 2020-05-24 DIAGNOSIS — S36029A Unspecified contusion of spleen, initial encounter: Secondary | ICD-10-CM

## 2020-05-24 DIAGNOSIS — Z888 Allergy status to other drugs, medicaments and biological substances status: Secondary | ICD-10-CM

## 2020-05-24 DIAGNOSIS — S7222XA Displaced subtrochanteric fracture of left femur, initial encounter for closed fracture: Principal | ICD-10-CM | POA: Diagnosis present

## 2020-05-24 DIAGNOSIS — E559 Vitamin D deficiency, unspecified: Secondary | ICD-10-CM | POA: Diagnosis present

## 2020-05-24 DIAGNOSIS — S32512A Fracture of superior rim of left pubis, initial encounter for closed fracture: Secondary | ICD-10-CM

## 2020-05-24 DIAGNOSIS — S02113A Unspecified occipital condyle fracture, initial encounter for closed fracture: Secondary | ICD-10-CM

## 2020-05-24 DIAGNOSIS — Z793 Long term (current) use of hormonal contraceptives: Secondary | ICD-10-CM

## 2020-05-24 DIAGNOSIS — Z20822 Contact with and (suspected) exposure to covid-19: Secondary | ICD-10-CM | POA: Diagnosis present

## 2020-05-24 DIAGNOSIS — F17210 Nicotine dependence, cigarettes, uncomplicated: Secondary | ICD-10-CM | POA: Diagnosis present

## 2020-05-24 DIAGNOSIS — S32009A Unspecified fracture of unspecified lumbar vertebra, initial encounter for closed fracture: Secondary | ICD-10-CM

## 2020-05-24 DIAGNOSIS — S22008A Other fracture of unspecified thoracic vertebra, initial encounter for closed fracture: Secondary | ICD-10-CM

## 2020-05-24 DIAGNOSIS — S2243XA Multiple fractures of ribs, bilateral, initial encounter for closed fracture: Secondary | ICD-10-CM | POA: Diagnosis present

## 2020-05-24 DIAGNOSIS — S27809A Unspecified injury of diaphragm, initial encounter: Secondary | ICD-10-CM | POA: Diagnosis present

## 2020-05-24 DIAGNOSIS — S36892A Contusion of other intra-abdominal organs, initial encounter: Secondary | ICD-10-CM | POA: Diagnosis present

## 2020-05-24 DIAGNOSIS — G8929 Other chronic pain: Secondary | ICD-10-CM | POA: Diagnosis present

## 2020-05-24 DIAGNOSIS — Z885 Allergy status to narcotic agent status: Secondary | ICD-10-CM

## 2020-05-24 DIAGNOSIS — D62 Acute posthemorrhagic anemia: Secondary | ICD-10-CM | POA: Diagnosis not present

## 2020-05-24 DIAGNOSIS — J9690 Respiratory failure, unspecified, unspecified whether with hypoxia or hypercapnia: Secondary | ICD-10-CM

## 2020-05-24 DIAGNOSIS — Y92410 Unspecified street and highway as the place of occurrence of the external cause: Secondary | ICD-10-CM

## 2020-05-24 DIAGNOSIS — Z881 Allergy status to other antibiotic agents status: Secondary | ICD-10-CM

## 2020-05-24 DIAGNOSIS — F319 Bipolar disorder, unspecified: Secondary | ICD-10-CM | POA: Diagnosis present

## 2020-05-24 DIAGNOSIS — S3600XA Unspecified injury of spleen, initial encounter: Secondary | ICD-10-CM | POA: Diagnosis present

## 2020-05-24 DIAGNOSIS — F1410 Cocaine abuse, uncomplicated: Secondary | ICD-10-CM | POA: Diagnosis present

## 2020-05-24 DIAGNOSIS — E8889 Other specified metabolic disorders: Secondary | ICD-10-CM | POA: Diagnosis present

## 2020-05-24 DIAGNOSIS — S32039A Unspecified fracture of third lumbar vertebra, initial encounter for closed fracture: Secondary | ICD-10-CM | POA: Diagnosis present

## 2020-05-24 DIAGNOSIS — S32302A Unspecified fracture of left ilium, initial encounter for closed fracture: Secondary | ICD-10-CM

## 2020-05-24 DIAGNOSIS — S36039A Unspecified laceration of spleen, initial encounter: Secondary | ICD-10-CM

## 2020-05-24 DIAGNOSIS — S272XXA Traumatic hemopneumothorax, initial encounter: Secondary | ICD-10-CM | POA: Diagnosis present

## 2020-05-24 DIAGNOSIS — S27321A Contusion of lung, unilateral, initial encounter: Secondary | ICD-10-CM | POA: Diagnosis present

## 2020-05-24 DIAGNOSIS — S2249XA Multiple fractures of ribs, unspecified side, initial encounter for closed fracture: Secondary | ICD-10-CM

## 2020-05-24 DIAGNOSIS — S32019A Unspecified fracture of first lumbar vertebra, initial encounter for closed fracture: Secondary | ICD-10-CM | POA: Diagnosis present

## 2020-05-24 LAB — RAPID URINE DRUG SCREEN, HOSP PERFORMED
Amphetamines: NOT DETECTED
Barbiturates: NOT DETECTED
Benzodiazepines: NOT DETECTED
Cocaine: POSITIVE — AB
Opiates: NOT DETECTED
Tetrahydrocannabinol: NOT DETECTED

## 2020-05-24 LAB — URINALYSIS, ROUTINE W REFLEX MICROSCOPIC
Bacteria, UA: NONE SEEN
Bilirubin Urine: NEGATIVE
Glucose, UA: 50 mg/dL — AB
Ketones, ur: 5 mg/dL — AB
Leukocytes,Ua: NEGATIVE
Nitrite: NEGATIVE
Protein, ur: >=300 mg/dL — AB
RBC / HPF: >50 RBC/hpf — ABNORMAL HIGH (ref 0–5)
Specific Gravity, Urine: 1.023 (ref 1.005–1.030)
WBC, UA: >50 WBC/hpf — ABNORMAL HIGH (ref 0–5)
pH: 7 (ref 5.0–8.0)

## 2020-05-24 LAB — COMPREHENSIVE METABOLIC PANEL
ALT: 77 U/L — ABNORMAL HIGH (ref 0–44)
AST: 150 U/L — ABNORMAL HIGH (ref 15–41)
Albumin: 3.4 g/dL — ABNORMAL LOW (ref 3.5–5.0)
Alkaline Phosphatase: 74 U/L (ref 38–126)
Anion gap: 6 (ref 5–15)
BUN: 12 mg/dL (ref 6–20)
CO2: 22 mmol/L (ref 22–32)
Calcium: 7.9 mg/dL — ABNORMAL LOW (ref 8.9–10.3)
Chloride: 106 mmol/L (ref 98–111)
Creatinine, Ser: 0.81 mg/dL (ref 0.44–1.00)
GFR, Estimated: >60 mL/min (ref >60)
Glucose, Bld: 145 mg/dL — ABNORMAL HIGH (ref 70–99)
Potassium: 3.2 mmol/L — ABNORMAL LOW (ref 3.5–5.1)
Sodium: 134 mmol/L — ABNORMAL LOW (ref 135–145)
Total Bilirubin: 0.7 mg/dL (ref 0.3–1.2)
Total Protein: 6.4 g/dL — ABNORMAL LOW (ref 6.5–8.1)

## 2020-05-24 LAB — CBC
HCT: 38.8 % (ref 36.0–46.0)
Hemoglobin: 12.7 g/dL (ref 12.0–15.0)
MCH: 32.1 pg (ref 26.0–34.0)
MCHC: 32.7 g/dL (ref 30.0–36.0)
MCV: 98 fL (ref 80.0–100.0)
Platelets: 238 10*3/uL (ref 150–400)
RBC: 3.96 MIL/uL (ref 3.87–5.11)
RDW: 12 % (ref 11.5–15.5)
WBC: 19.9 10*3/uL — ABNORMAL HIGH (ref 4.0–10.5)
nRBC: 0 % (ref 0.0–0.2)

## 2020-05-24 LAB — TYPE AND SCREEN
ABO/RH(D): O POS
Antibody Screen: NEGATIVE

## 2020-05-24 LAB — RESPIRATORY PANEL BY RT PCR (FLU A&B, COVID)
Influenza A by PCR: NEGATIVE
Influenza B by PCR: NEGATIVE
SARS Coronavirus 2 by RT PCR: NEGATIVE

## 2020-05-24 LAB — ETHANOL: Alcohol, Ethyl (B): <10 mg/dL (ref <10)

## 2020-05-24 LAB — PREGNANCY, URINE: Preg Test, Ur: NEGATIVE

## 2020-05-24 MED ORDER — HYDROMORPHONE HCL 1 MG/ML IJ SOLN
0.5000 mg | Freq: Once | INTRAMUSCULAR | Status: AC
  Administered 2020-05-24: 0.5 mg via INTRAVENOUS
  Filled 2020-05-24: qty 1

## 2020-05-24 MED ORDER — HYDROMORPHONE HCL 1 MG/ML IJ SOLN
1.0000 mg | Freq: Once | INTRAMUSCULAR | Status: AC
  Administered 2020-05-24: 1 mg via INTRAVENOUS
  Filled 2020-05-24: qty 1

## 2020-05-24 MED ORDER — LIDOCAINE-EPINEPHRINE (PF) 2 %-1:200000 IJ SOLN
20.0000 mL | Freq: Once | INTRAMUSCULAR | Status: AC
  Administered 2020-05-25: 20 mL

## 2020-05-24 MED ORDER — SODIUM CHLORIDE 0.9 % IV BOLUS
1000.0000 mL | Freq: Once | INTRAVENOUS | Status: AC
  Administered 2020-05-24: 1000 mL via INTRAVENOUS

## 2020-05-24 MED ORDER — IOHEXOL 300 MG/ML  SOLN
100.0000 mL | Freq: Once | INTRAMUSCULAR | Status: AC | PRN
  Administered 2020-05-24: 100 mL via INTRAVENOUS

## 2020-05-24 NOTE — ED Notes (Signed)
Pt denies needing to void at this time.  
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
## 2020-05-24 NOTE — ED Notes (Signed)
EKG done and seen by EDP Dr Denton Lank

## 2020-05-25 ENCOUNTER — Inpatient Hospital Stay (HOSPITAL_COMMUNITY): Payer: Self-pay

## 2020-05-25 ENCOUNTER — Encounter (HOSPITAL_COMMUNITY): Payer: Self-pay | Admitting: General Surgery

## 2020-05-25 ENCOUNTER — Encounter (HOSPITAL_COMMUNITY): Admission: EM | Disposition: A | Discharge status: Home / Self Care | Payer: Self-pay | Source: Home / Self Care

## 2020-05-25 ENCOUNTER — Inpatient Hospital Stay (HOSPITAL_COMMUNITY): Payer: Self-pay | Admitting: Certified Registered Nurse Anesthetist

## 2020-05-25 ENCOUNTER — Emergency Department (HOSPITAL_COMMUNITY): Payer: Self-pay

## 2020-05-25 DIAGNOSIS — S7222XA Displaced subtrochanteric fracture of left femur, initial encounter for closed fracture: Secondary | ICD-10-CM | POA: Diagnosis present

## 2020-05-25 DIAGNOSIS — J939 Pneumothorax, unspecified: Secondary | ICD-10-CM | POA: Diagnosis present

## 2020-05-25 HISTORY — PX: FEMUR IM NAIL: SHX1597

## 2020-05-25 LAB — TYPE AND SCREEN
ABO/RH(D): O POS
Antibody Screen: NEGATIVE

## 2020-05-25 LAB — HIV ANTIBODY (ROUTINE TESTING W REFLEX): HIV Screen 4th Generation wRfx: NONREACTIVE

## 2020-05-25 LAB — CBC
HCT: 28.6 % — ABNORMAL LOW (ref 36.0–46.0)
Hemoglobin: 9.2 g/dL — ABNORMAL LOW (ref 12.0–15.0)
MCH: 30.9 pg (ref 26.0–34.0)
MCHC: 32.2 g/dL (ref 30.0–36.0)
MCV: 96 fL (ref 80.0–100.0)
Platelets: 168 10*3/uL (ref 150–400)
RBC: 2.98 MIL/uL — ABNORMAL LOW (ref 3.87–5.11)
RDW: 12.3 % (ref 11.5–15.5)
WBC: 7.7 10*3/uL (ref 4.0–10.5)
nRBC: 0 % (ref 0.0–0.2)

## 2020-05-25 LAB — POCT I-STAT, CHEM 8
BUN: 10 mg/dL (ref 6–20)
Calcium, Ion: 1.16 mmol/L (ref 1.15–1.40)
Chloride: 105 mmol/L (ref 98–111)
Creatinine, Ser: 0.6 mg/dL (ref 0.44–1.00)
Glucose, Bld: 97 mg/dL (ref 70–99)
HCT: 34 % — ABNORMAL LOW (ref 36.0–46.0)
Hemoglobin: 11.6 g/dL — ABNORMAL LOW (ref 12.0–15.0)
Potassium: 4.7 mmol/L (ref 3.5–5.1)
Sodium: 141 mmol/L (ref 135–145)
TCO2: 25 mmol/L (ref 22–32)

## 2020-05-25 LAB — SURGICAL PCR SCREEN
MRSA, PCR: POSITIVE — AB
Staphylococcus aureus: POSITIVE — AB

## 2020-05-25 LAB — GLUCOSE, CAPILLARY: Glucose-Capillary: 151 mg/dL — ABNORMAL HIGH (ref 70–99)

## 2020-05-25 SURGERY — INSERTION, INTRAMEDULLARY ROD, FEMUR
Anesthesia: General | Laterality: Left

## 2020-05-25 MED ORDER — METHOCARBAMOL 1000 MG/10ML IJ SOLN
1000.0000 mg | Freq: Three times a day (TID) | INTRAVENOUS | Status: DC | PRN
  Filled 2020-05-25 (×2): qty 10

## 2020-05-25 MED ORDER — METOPROLOL TARTRATE 5 MG/5ML IV SOLN: 5.0000 mg | Freq: Four times a day (QID) | INTRAVENOUS | Status: DC | PRN

## 2020-05-25 MED ORDER — KETOROLAC TROMETHAMINE 15 MG/ML IJ SOLN
15.0000 mg | Freq: Four times a day (QID) | INTRAMUSCULAR | Status: AC
  Administered 2020-05-25 – 2020-05-26 (×5): 15 mg via INTRAVENOUS
  Filled 2020-05-25 (×4): qty 1

## 2020-05-25 MED ORDER — SODIUM CHLORIDE 0.9 % IV BOLUS
1000.0000 mL | Freq: Once | INTRAVENOUS | Status: AC
  Administered 2020-05-25: 1000 mL via INTRAVENOUS

## 2020-05-25 MED ORDER — FENTANYL CITRATE (PF) 100 MCG/2ML IJ SOLN
INTRAMUSCULAR | Status: AC
  Filled 2020-05-25: qty 2

## 2020-05-25 MED ORDER — DOCUSATE SODIUM 100 MG PO CAPS
100.0000 mg | ORAL_CAPSULE | Freq: Two times a day (BID) | ORAL | Status: DC
  Administered 2020-05-25 – 2020-05-30 (×10): 100 mg via ORAL
  Filled 2020-05-25 (×10): qty 1

## 2020-05-25 MED ORDER — HYDROMORPHONE HCL 1 MG/ML IJ SOLN
INTRAMUSCULAR | Status: AC
  Filled 2020-05-25: qty 0.5

## 2020-05-25 MED ORDER — HYDROMORPHONE HCL 1 MG/ML IJ SOLN
0.5000 mg | INTRAMUSCULAR | Status: DC | PRN
  Administered 2020-05-26 (×2): 1 mg via INTRAVENOUS
  Administered 2020-05-26: 0.5 mg via INTRAVENOUS
  Administered 2020-05-26 – 2020-05-27 (×3): 1 mg via INTRAVENOUS
  Filled 2020-05-25 (×7): qty 1

## 2020-05-25 MED ORDER — KETOROLAC TROMETHAMINE 15 MG/ML IJ SOLN
INTRAMUSCULAR | Status: AC
  Filled 2020-05-25: qty 1

## 2020-05-25 MED ORDER — METHOCARBAMOL 1000 MG/10ML IJ SOLN
1000.0000 mg | Freq: Three times a day (TID) | INTRAVENOUS | Status: DC
  Filled 2020-05-25: qty 10

## 2020-05-25 MED ORDER — PROMETHAZINE HCL 25 MG/ML IJ SOLN: 6.2500 mg | INTRAMUSCULAR | Status: DC | PRN

## 2020-05-25 MED ORDER — PHENYLEPHRINE 40 MCG/ML (10ML) SYRINGE FOR IV PUSH (FOR BLOOD PRESSURE SUPPORT)
PREFILLED_SYRINGE | INTRAVENOUS | Status: AC
  Filled 2020-05-25: qty 10

## 2020-05-25 MED ORDER — CHLORHEXIDINE GLUCONATE CLOTH 2 % EX PADS
6.0000 | MEDICATED_PAD | Freq: Every day | CUTANEOUS | Status: DC
  Administered 2020-05-26 – 2020-05-27 (×2): 6 via TOPICAL

## 2020-05-25 MED ORDER — PROPOFOL 10 MG/ML IV BOLUS
INTRAVENOUS | Status: DC | PRN
  Administered 2020-05-25: 200 mg via INTRAVENOUS

## 2020-05-25 MED ORDER — LORAZEPAM 2 MG/ML IJ SOLN: 1.0000 mg | Freq: Four times a day (QID) | INTRAMUSCULAR | Status: DC | PRN

## 2020-05-25 MED ORDER — FENTANYL CITRATE (PF) 100 MCG/2ML IJ SOLN: 50.0000 ug | INTRAMUSCULAR | Status: DC | PRN

## 2020-05-25 MED ORDER — OXYCODONE HCL 5 MG PO TABS
5.0000 mg | ORAL_TABLET | ORAL | Status: DC | PRN
  Administered 2020-05-25 – 2020-05-30 (×20): 10 mg via ORAL
  Filled 2020-05-25 (×21): qty 2

## 2020-05-25 MED ORDER — SUCCINYLCHOLINE CHLORIDE 200 MG/10ML IV SOSY
PREFILLED_SYRINGE | INTRAVENOUS | Status: AC
  Filled 2020-05-25: qty 10

## 2020-05-25 MED ORDER — CEFAZOLIN SODIUM-DEXTROSE 2-4 GM/100ML-% IV SOLN
2.0000 g | Freq: Three times a day (TID) | INTRAVENOUS | Status: AC
  Administered 2020-05-25 – 2020-05-26 (×3): 2 g via INTRAVENOUS
  Filled 2020-05-25 (×3): qty 100

## 2020-05-25 MED ORDER — DEXAMETHASONE SODIUM PHOSPHATE 10 MG/ML IJ SOLN
INTRAMUSCULAR | Status: AC
  Filled 2020-05-25: qty 1

## 2020-05-25 MED ORDER — ONDANSETRON HCL 4 MG/2ML IJ SOLN
INTRAMUSCULAR | Status: DC | PRN
  Administered 2020-05-25: 4 mg via INTRAVENOUS

## 2020-05-25 MED ORDER — ONDANSETRON 4 MG PO TBDP: 4.0000 mg | ORAL_TABLET | Freq: Four times a day (QID) | ORAL | Status: DC | PRN

## 2020-05-25 MED ORDER — PANTOPRAZOLE SODIUM 40 MG IV SOLR: 40.0000 mg | Freq: Every day | INTRAVENOUS | Status: DC

## 2020-05-25 MED ORDER — ONDANSETRON HCL 4 MG/2ML IJ SOLN: 4.0000 mg | Freq: Four times a day (QID) | INTRAMUSCULAR | Status: DC | PRN

## 2020-05-25 MED ORDER — ACETAMINOPHEN 500 MG PO TABS
1000.0000 mg | ORAL_TABLET | Freq: Four times a day (QID) | ORAL | Status: AC
  Administered 2020-05-25 – 2020-05-26 (×4): 1000 mg via ORAL
  Filled 2020-05-25 (×4): qty 2

## 2020-05-25 MED ORDER — LORAZEPAM 2 MG/ML IJ SOLN
1.0000 mg | INTRAMUSCULAR | Status: DC | PRN
  Administered 2020-05-25 – 2020-05-27 (×6): 1 mg via INTRAVENOUS
  Filled 2020-05-25 (×6): qty 1

## 2020-05-25 MED ORDER — DIPHENHYDRAMINE HCL 50 MG/ML IJ SOLN
25.0000 mg | Freq: Four times a day (QID) | INTRAMUSCULAR | Status: DC | PRN
  Administered 2020-05-25: 25 mg via INTRAVENOUS
  Filled 2020-05-25 (×2): qty 1

## 2020-05-25 MED ORDER — ALBUMIN HUMAN 5 % IV SOLN: INTRAVENOUS | Status: DC | PRN

## 2020-05-25 MED ORDER — FENTANYL CITRATE (PF) 250 MCG/5ML IJ SOLN
INTRAMUSCULAR | Status: DC | PRN
  Administered 2020-05-25: 50 ug via INTRAVENOUS
  Administered 2020-05-25: 100 ug via INTRAVENOUS
  Administered 2020-05-25: 50 ug via INTRAVENOUS
  Administered 2020-05-25: 100 ug via INTRAVENOUS

## 2020-05-25 MED ORDER — LIDOCAINE 2% (20 MG/ML) 5 ML SYRINGE
INTRAMUSCULAR | Status: DC | PRN
  Administered 2020-05-25: 100 mg via INTRAVENOUS

## 2020-05-25 MED ORDER — ROCURONIUM BROMIDE 10 MG/ML (PF) SYRINGE
PREFILLED_SYRINGE | INTRAVENOUS | Status: DC | PRN
  Administered 2020-05-25: 20 mg via INTRAVENOUS
  Administered 2020-05-25: 50 mg via INTRAVENOUS

## 2020-05-25 MED ORDER — METHOCARBAMOL 500 MG PO TABS
1000.0000 mg | ORAL_TABLET | Freq: Three times a day (TID) | ORAL | Status: DC
  Administered 2020-05-25 – 2020-05-30 (×16): 1000 mg via ORAL
  Filled 2020-05-25 (×16): qty 2

## 2020-05-25 MED ORDER — CEFAZOLIN SODIUM-DEXTROSE 2-4 GM/100ML-% IV SOLN
INTRAVENOUS | Status: AC
  Filled 2020-05-25: qty 100

## 2020-05-25 MED ORDER — POLYETHYLENE GLYCOL 3350 17 G PO PACK: 17.0000 g | PACK | Freq: Every day | ORAL | Status: DC | PRN

## 2020-05-25 MED ORDER — DEXTROSE-NACL 5-0.9 % IV SOLN: INTRAVENOUS | Status: DC

## 2020-05-25 MED ORDER — VANCOMYCIN HCL 1000 MG IV SOLR
INTRAVENOUS | Status: DC | PRN
  Administered 2020-05-25: 1000 mg

## 2020-05-25 MED ORDER — MIDAZOLAM HCL 2 MG/2ML IJ SOLN
INTRAMUSCULAR | Status: DC | PRN
  Administered 2020-05-25: 2 mg via INTRAVENOUS

## 2020-05-25 MED ORDER — PHENYLEPHRINE 40 MCG/ML (10ML) SYRINGE FOR IV PUSH (FOR BLOOD PRESSURE SUPPORT)
PREFILLED_SYRINGE | INTRAVENOUS | Status: DC | PRN
  Administered 2020-05-25 (×4): 80 ug via INTRAVENOUS

## 2020-05-25 MED ORDER — VANCOMYCIN HCL 1000 MG IV SOLR
INTRAVENOUS | Status: AC
  Filled 2020-05-25: qty 1000

## 2020-05-25 MED ORDER — MIDAZOLAM HCL 2 MG/2ML IJ SOLN
INTRAMUSCULAR | Status: AC
  Filled 2020-05-25: qty 2

## 2020-05-25 MED ORDER — DEXAMETHASONE SODIUM PHOSPHATE 10 MG/ML IJ SOLN
INTRAMUSCULAR | Status: DC | PRN
  Administered 2020-05-25: 10 mg via INTRAVENOUS

## 2020-05-25 MED ORDER — 0.9 % SODIUM CHLORIDE (POUR BTL) OPTIME
TOPICAL | Status: DC | PRN
  Administered 2020-05-25: 1000 mL

## 2020-05-25 MED ORDER — MUPIROCIN 2 % EX OINT
1.0000 "application " | TOPICAL_OINTMENT | Freq: Two times a day (BID) | CUTANEOUS | Status: AC
  Administered 2020-05-25 – 2020-05-29 (×7): 1 via NASAL
  Filled 2020-05-25 (×2): qty 22

## 2020-05-25 MED ORDER — ONDANSETRON HCL 4 MG/2ML IJ SOLN
4.0000 mg | Freq: Four times a day (QID) | INTRAMUSCULAR | Status: DC | PRN
  Administered 2020-05-26: 4 mg via INTRAVENOUS
  Filled 2020-05-25: qty 2

## 2020-05-25 MED ORDER — ONDANSETRON HCL 4 MG/2ML IJ SOLN
INTRAMUSCULAR | Status: AC
  Filled 2020-05-25: qty 2

## 2020-05-25 MED ORDER — HYDROMORPHONE HCL 1 MG/ML IJ SOLN
2.0000 mg | INTRAMUSCULAR | Status: DC | PRN
  Administered 2020-05-25: 2 mg via INTRAVENOUS
  Filled 2020-05-25: qty 2

## 2020-05-25 MED ORDER — HYDROMORPHONE HCL 1 MG/ML IJ SOLN
0.5000 mg | INTRAMUSCULAR | Status: DC | PRN
  Administered 2020-05-25: 0.5 mg via INTRAVENOUS

## 2020-05-25 MED ORDER — LIDOCAINE 2% (20 MG/ML) 5 ML SYRINGE
INTRAMUSCULAR | Status: AC
  Filled 2020-05-25: qty 5

## 2020-05-25 MED ORDER — FENTANYL CITRATE (PF) 100 MCG/2ML IJ SOLN
25.0000 ug | INTRAMUSCULAR | Status: DC | PRN
  Administered 2020-05-25 (×4): 25 ug via INTRAVENOUS

## 2020-05-25 MED ORDER — PANTOPRAZOLE SODIUM 40 MG PO TBEC: 40.0000 mg | DELAYED_RELEASE_TABLET | Freq: Every day | ORAL | Status: DC

## 2020-05-25 MED ORDER — HYDROMORPHONE HCL 1 MG/ML IJ SOLN
1.0000 mg | Freq: Once | INTRAMUSCULAR | Status: AC
  Administered 2020-05-25: 1 mg via INTRAVENOUS
  Filled 2020-05-25: qty 1

## 2020-05-25 MED ORDER — HYDROMORPHONE HCL 1 MG/ML IJ SOLN
1.0000 mg | INTRAMUSCULAR | Status: DC | PRN
  Filled 2020-05-25: qty 1

## 2020-05-25 MED ORDER — FENTANYL CITRATE (PF) 250 MCG/5ML IJ SOLN
INTRAMUSCULAR | Status: AC
  Filled 2020-05-25: qty 5

## 2020-05-25 MED ORDER — PANTOPRAZOLE SODIUM 40 MG PO TBEC
40.0000 mg | DELAYED_RELEASE_TABLET | Freq: Every day | ORAL | Status: DC
  Administered 2020-05-26 – 2020-05-27 (×2): 40 mg via ORAL
  Filled 2020-05-25 (×2): qty 1

## 2020-05-25 MED ORDER — METOCLOPRAMIDE HCL 5 MG PO TABS
5.0000 mg | ORAL_TABLET | Freq: Three times a day (TID) | ORAL | Status: DC | PRN
  Filled 2020-05-25: qty 2

## 2020-05-25 MED ORDER — SUGAMMADEX SODIUM 200 MG/2ML IV SOLN
INTRAVENOUS | Status: DC | PRN
  Administered 2020-05-25: 200 mg via INTRAVENOUS

## 2020-05-25 MED ORDER — POTASSIUM CHLORIDE 10 MEQ/100ML IV SOLN: 10.0000 meq | INTRAVENOUS | Status: AC

## 2020-05-25 MED ORDER — ROCURONIUM BROMIDE 10 MG/ML (PF) SYRINGE
PREFILLED_SYRINGE | INTRAVENOUS | Status: AC
  Filled 2020-05-25: qty 10

## 2020-05-25 MED ORDER — ONDANSETRON HCL 4 MG PO TABS
4.0000 mg | ORAL_TABLET | Freq: Four times a day (QID) | ORAL | Status: DC | PRN
  Administered 2020-05-28 – 2020-05-30 (×2): 4 mg via ORAL
  Filled 2020-05-25 (×2): qty 1

## 2020-05-25 MED ORDER — SUCCINYLCHOLINE CHLORIDE 200 MG/10ML IV SOSY
PREFILLED_SYRINGE | INTRAVENOUS | Status: DC | PRN
  Administered 2020-05-25: 120 mg via INTRAVENOUS

## 2020-05-25 MED ORDER — LACTATED RINGERS IV SOLN: INTRAVENOUS | Status: DC

## 2020-05-25 MED ORDER — METOCLOPRAMIDE HCL 5 MG/ML IJ SOLN: 5.0000 mg | Freq: Three times a day (TID) | INTRAMUSCULAR | Status: DC | PRN

## 2020-05-25 MED ORDER — PROPOFOL 10 MG/ML IV BOLUS
INTRAVENOUS | Status: AC
  Filled 2020-05-25: qty 20

## 2020-05-25 SURGICAL SUPPLY — 68 items
ADH SKN CLS LQ APL DERMABOND (GAUZE/BANDAGES/DRESSINGS) ×2
APL PRP STRL LF DISP 70% ISPRP (MISCELLANEOUS) ×1
BIT DRILL SHORT 4.2 (BIT) IMPLANT
BLADE SURG 10 STRL SS (BLADE) ×6 IMPLANT
BNDG CMPR MED 10X6 ELC LF (GAUZE/BANDAGES/DRESSINGS) ×1
BNDG COHESIVE 4X5 TAN STRL (GAUZE/BANDAGES/DRESSINGS) ×3 IMPLANT
BNDG COHESIVE 6X5 TAN STRL LF (GAUZE/BANDAGES/DRESSINGS) IMPLANT
BNDG ELASTIC 4X5.8 VLCR STR LF (GAUZE/BANDAGES/DRESSINGS) ×3 IMPLANT
BNDG ELASTIC 6X10 VLCR STRL LF (GAUZE/BANDAGES/DRESSINGS) ×2 IMPLANT
BNDG ELASTIC 6X5.8 VLCR STR LF (GAUZE/BANDAGES/DRESSINGS) ×3 IMPLANT
BRUSH SCRUB EZ PLAIN DRY (MISCELLANEOUS) ×6 IMPLANT
CHLORAPREP W/TINT 26 (MISCELLANEOUS) ×3 IMPLANT
COVER SURGICAL LIGHT HANDLE (MISCELLANEOUS) ×3 IMPLANT
COVER WAND RF STERILE (DRAPES) ×3 IMPLANT
DERMABOND ADHESIVE PROPEN (GAUZE/BANDAGES/DRESSINGS) ×4
DERMABOND ADVANCED .7 DNX6 (GAUZE/BANDAGES/DRESSINGS) IMPLANT
DRAPE C-ARM 35X43 STRL (DRAPES) ×3 IMPLANT
DRAPE C-ARMOR (DRAPES) ×3 IMPLANT
DRAPE HALF SHEET 40X57 (DRAPES) ×6 IMPLANT
DRAPE IMP U-DRAPE 54X76 (DRAPES) ×6 IMPLANT
DRAPE INCISE IOBAN 66X45 STRL (DRAPES) ×3 IMPLANT
DRAPE ORTHO SPLIT 77X108 STRL (DRAPES) ×6
DRAPE SURG 17X23 STRL (DRAPES) ×3 IMPLANT
DRAPE SURG ORHT 6 SPLT 77X108 (DRAPES) ×2 IMPLANT
DRAPE U-SHAPE 47X51 STRL (DRAPES) ×7 IMPLANT
DRILL BIT SHORT 4.2 (BIT) ×6
DRSG MEPILEX BORDER 4X4 (GAUZE/BANDAGES/DRESSINGS) ×7 IMPLANT
DRSG MEPILEX BORDER 4X8 (GAUZE/BANDAGES/DRESSINGS) ×3 IMPLANT
ELECT REM PT RETURN 9FT ADLT (ELECTROSURGICAL) ×3
ELECTRODE REM PT RTRN 9FT ADLT (ELECTROSURGICAL) ×1 IMPLANT
GLOVE BIO SURGEON STRL SZ 6.5 (GLOVE) ×6 IMPLANT
GLOVE BIO SURGEON STRL SZ7.5 (GLOVE) ×9 IMPLANT
GLOVE BIO SURGEONS STRL SZ 6.5 (GLOVE) ×3
GLOVE BIOGEL PI IND STRL 6.5 (GLOVE) ×1 IMPLANT
GLOVE BIOGEL PI IND STRL 7.5 (GLOVE) ×1 IMPLANT
GLOVE BIOGEL PI INDICATOR 6.5 (GLOVE) ×2
GLOVE BIOGEL PI INDICATOR 7.5 (GLOVE) ×2
GOWN STRL REUS W/ TWL LRG LVL3 (GOWN DISPOSABLE) ×3 IMPLANT
GOWN STRL REUS W/ TWL XL LVL3 (GOWN DISPOSABLE) ×1 IMPLANT
GOWN STRL REUS W/TWL LRG LVL3 (GOWN DISPOSABLE) ×9
GOWN STRL REUS W/TWL XL LVL3 (GOWN DISPOSABLE) ×3
GUIDEWIRE 3.2X400 (WIRE) ×6 IMPLANT
KIT BASIN OR (CUSTOM PROCEDURE TRAY) ×3 IMPLANT
KIT TURNOVER KIT B (KITS) ×3 IMPLANT
MANIFOLD NEPTUNE II (INSTRUMENTS) ×3 IMPLANT
NAIL CANN FRN TI 9X340 LT (Nail) ×2 IMPLANT
NS IRRIG 1000ML POUR BTL (IV SOLUTION) ×3 IMPLANT
PACK GENERAL/GYN (CUSTOM PROCEDURE TRAY) ×3 IMPLANT
PAD ARMBOARD 7.5X6 YLW CONV (MISCELLANEOUS) ×6 IMPLANT
PAD CAST 4YDX4 CTTN HI CHSV (CAST SUPPLIES) IMPLANT
PADDING CAST COTTON 4X4 STRL (CAST SUPPLIES) ×3
PADDING CAST COTTON 6X4 STRL (CAST SUPPLIES) ×2 IMPLANT
REAMER ROD DEEP FLUTE 2.5X950 (INSTRUMENTS) ×2 IMPLANT
SCREW LOCK STAR 5X40 (Screw) ×2 IMPLANT
SCREW LOCK STAR 5X46 (Screw) ×2 IMPLANT
SCREW RECON TI T25 6.5X80 (Screw) ×4 IMPLANT
STAPLER VISISTAT 35W (STAPLE) ×3 IMPLANT
STOCKINETTE IMPERVIOUS LG (DRAPES) ×3 IMPLANT
SUT MON AB 3-0 SH 27 (SUTURE) ×3
SUT MON AB 3-0 SH27 (SUTURE) IMPLANT
SUT VIC AB 0 CT1 27 (SUTURE) ×3
SUT VIC AB 0 CT1 27XBRD ANBCTR (SUTURE) IMPLANT
SUT VIC AB 2-0 CT1 27 (SUTURE) ×6
SUT VIC AB 2-0 CT1 TAPERPNT 27 (SUTURE) ×2 IMPLANT
TOWEL GREEN STERILE (TOWEL DISPOSABLE) ×6 IMPLANT
TOWEL GREEN STERILE FF (TOWEL DISPOSABLE) ×3 IMPLANT
UNDERPAD 30X36 HEAVY ABSORB (UNDERPADS AND DIAPERS) ×3 IMPLANT
WATER STERILE IRR 1000ML POUR (IV SOLUTION) ×3 IMPLANT
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
## 2020-05-25 NOTE — Anesthesia Procedure Notes (Addendum)
Procedure Name: Intubation Date/Time: 05/25/2020 10:15 AM Performed by: Rosalio Macadamia, CRNA Pre-anesthesia Checklist: Patient identified, Emergency Drugs available, Suction available, Patient being monitored and Timeout performed Patient Re-evaluated:Patient Re-evaluated prior to induction Oxygen Delivery Method: Circle system utilized Preoxygenation: Pre-oxygenation with 100% oxygen Induction Type: IV induction and Cricoid Pressure applied Ventilation: Mask ventilation without difficulty Laryngoscope Size: Glidescope and 3 Grade View: Grade I Tube type: Subglottic suction tube Tube size: 7.0 mm Number of attempts: 1 Airway Equipment and Method: Stylet and Video-laryngoscopy Placement Confirmation: ETT inserted through vocal cords under direct vision,  breath sounds checked- equal and bilateral and CO2 detector Secured at: 22 cm Tube secured with: Tape Dental Injury: Teeth and Oropharynx as per pre-operative assessment  Difficulty Due To: Difficulty was anticipated, Difficult Airway- due to reduced neck mobility, Difficult Airway- due to cervical collar, Difficult Airway- due to dentition and Difficult Airway- due to limited oral opening Comments: C-collar remains in place. Head/neck remain in midline and neutral position for induction and intubation
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
## 2020-05-25 NOTE — H&P (Addendum)
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 could reflect contusion or distributed retroperitoneal hemorrhage. 8. Small mesenteric contusion/in the left upper quadrant without active contrast extravasation. 9. Predominantly subtrochanteric left femur fracture with significant rotation of the proximal femur fragment which remains normally seated in the acetabulum and varus angulation across the fracture line. Extensive surrounding thigh hematoma and intramuscular hemorrhage. 10. Comminuted fracture of the left iliac crest with surrounding intramuscular hemorrhage of the iliacus and gluteal musculature, stranding and thickening in the inferior lumbar triangle without frank dehiscence or herniation. 11. Minimally displaced fracture of the left pubic body with trace extraperitoneal hemorrhage. 12. Wedge-shaped soft tissue in the anterior mediastinum, possibly thymic remnant though cannot exclude small mediastinal hematoma given extensive traumatic  findings. These results were called by telephone at the time of interpretation on 05/25/2020 at 2:44 am  to provider Dr. Clayborne DanaMesner, who verbally acknowledged these results. Electronically Signed   By: Kreg ShropshirePrice  DeHay M.D.   On: 05/25/2020 02:44   CT CHEST W CONTRAST  Result Date: 05/25/2020 CLINICAL DATA:  Larey SeatFell into ditch, unclear history EXAM: CT HEAD WITHOUT CONTRAST CT CERVICAL SPINE WITHOUT CONTRAST CT CHEST, ABDOMEN AND PELVIS WITH CONTRAST TECHNIQUE: Contiguous axial images were obtained from the base of the skull through the vertex without intravenous contrast. Multidetector CT imaging of the cervical spine was performed without intravenous contrast. Multiplanar CT image reconstructions were also generated. Multidetector CT imaging of the chest, abdomen and pelvis was performed following the standard protocol during bolus administration of intravenous contrast. CONTRAST:  100mL OMNIPAQUE IOHEXOL 300 MG/ML  SOLN COMPARISON:  CT abdomen and pelvis 01/19/2017, CT head 06/15/2003 FINDINGS: CT HEAD FINDINGS Brain: No evidence of acute infarction, hemorrhage, hydrocephalus, extra-axial collection. Scattered benign dural calcifications. 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 No new worrisome masses or mass effect. Vascular: No hyperdense vessel or unexpected calcification. Skull: No calvarial fracture or suspicious osseous lesion. No scalp swelling or hematoma. Left frontoparietal scalp swelling and trace crescentic hematoma measuring up to 3 mm in maximal thickness. Sinuses/Orbits: Subtotal opacification of the maxillary sinuses with pneumatized secretions. Additional thickening in the ethmoids and right sphenoid sinus. Mastoid air cells are predominantly clear. Middle ear cavities are clear. Ossicular chains are grossly normal in configuration. Included orbital structures are unremarkable. Other: Soft tissue gas is seen tracking superiorly within the suboccipital soft  tissues and deep spaces of the left neck. CT CERVICAL FINDINGS Alignment: Cervical stabilization collar is absent at the time of examination. No evidence of traumatic listhesis. No abnormally widened, perched or jumped facets. Normal alignment of the craniocervical and atlantoaxial articulations. Skull base and vertebrae: Fracture of the medial right occipital condyle. No other visible skull base fracture. No vertebral body fracture or height loss is seen. Posterior elements remain intact. Normal bone mineralization. No worrisome osseous lesions. Soft tissues and spinal canal: Extensive soft tissue gas is seen throughout the soft tissues of the base of the neck extending throughout the musculature both anterior and posteriorly and within the retropharyngeal spaces. This is most likely related to a pneumomediastinum described the chest sections below. No prevertebral fluid swelling or hemorrhage. No visible canal hematoma. Disc levels: No significant central canal or foraminal stenosis identified within the imaged levels of the spine. Other: Multiple carious lesions and periapical lucencies with a partially included dentition. CT CHEST FINDINGS Cardiovascular: The aortic root and ascending aorta is suboptimally assessed given cardiac pulsation artifact. No acute luminal abnormality of the imaged aorta. No focal periaortic stranding or hemorrhage. Left vertebral artery arises directly from the aortic arch. Proximal great vessels as included are unremarkable. Central pulmonary arteries are caliber without large central filling defect. More distal evaluation limited on this non tailored exam. No major venous abnormalities are seen. Mediastinum/Nodes: Pneumomediastinum is seen predominantly towards the thoracic inlet and in the posterior mediastinum. Some wedge-shaped soft tissue attenuation is seen draping over the anterior mediastinum which could reflect thymic remnant though contusion is not excluded given extensive  traumatic findings in the chest. No worrisome mediastinal or hilar nodes. Thyroid gland is unremarkable. No acute traumatic abnormality of the thoracic esophagus. No clear discontinuity or evidence of direct injury to the trachea. Lungs/Pleura: Moderate left pneumothorax with small volume of layering hemothorax. Atelectatic changes are present with additional areas of more focal consolidative opacity and tiny air lucencies likely  reflecting a combination of pulmonary contusion and laceration/traumatic pneumatocele related to the multitude of left-sided rib fractures, predominantly seen in the posterior left lower lobe. Some dependent atelectatic changes are present in the right lung as well though mild underlying contusion is not fully excluded. Trace right pneumothorax is seen along the azygoesophageal recess. Musculoskeletal: Minimally displaced anterolateral fractures of the left second through eighth rib. Additional posterolateral displaced fractures the fifth through ninth ribs reflecting segmental injury. Minimally displaced fractures of the right second and third ribs as well. Sternum and manubrium are intact. Scapula are intact. Clavicles are intact aside albeit with slight asymmetric widening of the right sternoclavicular joint. Extensive subcutaneous emphysema and gas extending across the chest wall, base of the neck, anterior and posterior upper thoracic soft tissues, the soft tissues of the left breast and through the left chest wall, flank and abdominal soft tissues. There is slight asymmetric thickening of the right diaphragmatic crura. CT ABDOMEN PELVIS FINDINGS Hepatobiliary: Band of hypoattenuation is seen within the posterior left lobe liver adjacent the falciform ligament which could reflect small liver laceration measuring up to 2 cm in depth (6/354, coronal 4/41). Trace hemorrhage is seen at the tip of the right lobe liver. Focal fatty infiltration noted at the falciform ligament. Concerning  focal liver lesion. Prior cholecystectomy. Normal biliary tree without visible calcified gallstone. Pancreas: No visible pancreatic contusion or ductal disruption. No pancreatic ductal dilatation or surrounding inflammatory changes. Spleen: At least 2 lacerations in the inferior spleen, each measuring up to approximately 2.5 cm in depth with a subcapsular hematoma extending over approximately 40% of the surface area of the spleen itself with minimal intraperitoneal extension. No contained vascular injury is evident. No active contrast extravasation. Adrenals/Urinary Tract: No evidence of adrenal hemorrhage nor suspicious adrenal lesion. No direct renal injury is identified though there is a small amount of trace fluid/possible hemorrhage in the left anterior pararenal space. Kidneys enhance and excrete symmetrically. No concerning focal renal lesion. No urolithiasis or hydronephrosis. Bladder decompressed by Foley catheter. No convincing CT evidence of direct bladder injury at this time. Stomach/Bowel: Distal esophagus and stomach are unremarkable. There is a small amount stranding and thickening near the duodenal bulb in the right upper quadrant (6/423). No other small bowel thickening or dilatation. Cecum displaced to the midline pelvis. Appendix not well visualized. No colonic dilatation or wall thickening. Vascular/Lymphatic: No visible direct vascular injury in the abdomen or pelvis. No sites of active contrast extravasation are seen. No suspicious or enlarged lymph nodes in the included lymphatic chains. Reproductive: Anteverted uterus with radiopaque IUD in expected positioning. Heterogeneous enhancement of the uterus is likely related to contrast timing. No concerning adnexal lesions. Other: Nonspecific stranding and trace fluid is seen in the mid mesentery to the left of midline (6/47), could reflect a small mesenteric contusion. Additional focus of soft tissue stranding and likely hemorrhage is seen  adjacent the left iliacus predominantly within the extra peritoneum though some slight intraperitoneal extension may be present (6/595). Hemoperitoneum seen layering in the right pericolic gutter tracking inferiorly from the tip of the liver. Small volume hemoperitoneum layering in the deep pelvis as well. No free air within the abdomen or pelvis. No traumatic abdominal wall dehiscence. Though there is thickening and stranding adjacent the left inferior lumbar triangle adjacent the iliac wing fracture, as described below. Gas extending across the abdominal wall and left flank and posterior soft tissues. Musculoskeletal: Comminuted fracture left iliac crest with associated intramuscular hemorrhage within the iliacus as  well as the gluteal musculature. Additional predominantly subtrochanteric left femur fracture with varus angulation across the fracture line and significant external rotation of the fracture fragment containing the articular surface of the femoral head which is normally located within the acetabulum. Minimally displaced fracture of the left pubic body as well. Significant swelling and intramuscular hemorrhage within the proximal compartments of the thigh adjacent fifth femur fracture without discernible active contrast extravasation. Small amount of extraperitoneal hemorrhage adjacent the left pubic body fracture. IMPRESSION: CT head: 1. Left frontoparietal scalp swelling and trace crescentic hematoma measuring up to 3 mm in maximal thickness. 2. No calvarial fracture. 3. No acute intracranial abnormality. CT cervical spine: 1. Fracture of the medial right occipital condyle. 2. No other skull base or cervical spine fracture or traumatic listhesis. 3. Soft tissue gas tracking at the base of the neck and suboccipital soft tissues tracking from more extensive gas in the chest wall likely related to multitude of rib fractures detailed below. CT chest, abdomen and pelvis: 1. Left second through ninth rib  fractures including additional posterior fractures of the fifth through ninth ribs reflecting segmental injury. Correlate for flail chest physiology. Additional minimally displaced right second and third anterior rib fractures. 2. Moderate left hemo pneumothorax with areas of pulmonary contusion and laceration predominantly in the left lower lobe. Trace right pneumothorax present as well. Pneumomediastinum is present predominantly within the thoracic inlet and in the posterior mediastinum. 3. Extensive soft tissue gas throughout the soft tissues of the base of the neck, chest wall, left flank and posterior abdominal soft tissues. 4. AAST grade 2 liver injury: 2 cm laceration in the posterior left lobe liver adjacent the falciform ligament with small amount of adjacent subcapsular hematoma and hemorrhage tracking from the liver tip to the pericolic gutter. No active contrast extravasation. 5. AST grade 3 splenic injury: At least 2 small lacerations involving the inferior pole of the spleen each measuring up to 2.5 cm in depth with a subcapsular hematoma extending across approximately 40% of the surface area. Minimal extension into the peritoneum. No active contrast extravasation. 6. Mild asymmetric thickening of the right diaphragmatic crura, could reflect a mild diaphragmatic injury. 7. Small amount of nonspecific stranding in the left anterior pararenal space, could reflect contusion or distributed retroperitoneal hemorrhage. 8. Small mesenteric contusion/in the left upper quadrant without active contrast extravasation. 9. Predominantly subtrochanteric left femur fracture with significant rotation of the proximal femur fragment which remains normally seated in the acetabulum and varus angulation across the fracture line. Extensive surrounding thigh hematoma and intramuscular hemorrhage. 10. Comminuted fracture of the left iliac crest with surrounding intramuscular hemorrhage of the iliacus and gluteal musculature,  stranding and thickening in the inferior lumbar triangle without frank dehiscence or herniation. 11. Minimally displaced fracture of the left pubic body with trace extraperitoneal hemorrhage. 12. Wedge-shaped soft tissue in the anterior mediastinum, possibly thymic remnant though cannot exclude small mediastinal hematoma given extensive traumatic findings. These results were called by telephone at the time of interpretation on 05/25/2020 at 2:44 am to provider Dr. Clayborne Dana, who verbally acknowledged these results. Electronically Signed   By: Kreg Shropshire M.D.   On: 05/25/2020 02:44   CT CERVICAL SPINE WO CONTRAST  Result Date: 05/25/2020 CLINICAL DATA:  Fell into ditch, unclear history EXAM: CT HEAD WITHOUT CONTRAST CT CERVICAL SPINE WITHOUT CONTRAST CT CHEST, ABDOMEN AND PELVIS WITH CONTRAST TECHNIQUE: Contiguous axial images were obtained from the base of the skull through the vertex without intravenous contrast. Multidetector CT  imaging of the cervical spine was performed without intravenous contrast. Multiplanar CT image reconstructions were also generated. Multidetector CT imaging of the chest, abdomen and pelvis was performed following the standard protocol during bolus administration of intravenous contrast. CONTRAST:  OMNIPAQUE IOHEXOL 300 MG/ML  SOLN COMPARISON:  CT abdomen and pelvis 01/19/2017, CT head 06/15/2003 FINDINGS: CT HEAD FINDINGS Brain: No evidence of acute infarction, hemorrhage, hydrocephalus, extra-axial collection. Scattered benign dural calcifications. 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 No new worrisome masses or mass effect. Vascular: No hyperdense vessel or unexpected calcification. Skull: No calvarial fracture or suspicious osseous lesion. No scalp swelling or hematoma. Left frontoparietal scalp swelling and trace crescentic hematoma measuring up to 3 mm in maximal thickness. Sinuses/Orbits: Subtotal opacification of the maxillary  sinuses with pneumatized secretions. Additional thickening in the ethmoids and right sphenoid sinus. Mastoid air cells are predominantly clear. Middle ear cavities are clear. Ossicular chains are grossly normal in configuration. Included orbital structures are unremarkable. Other: Soft tissue gas is seen tracking superiorly within the suboccipital soft tissues and deep spaces of the left neck. CT CERVICAL FINDINGS Alignment: Cervical stabilization collar is absent at the time of examination. No evidence of traumatic listhesis. No abnormally widened, perched or jumped facets. Normal alignment of the craniocervical and atlantoaxial articulations. Skull base and vertebrae: Fracture of the medial right occipital condyle. No other visible skull base fracture. No vertebral body fracture or height loss is seen. Posterior elements remain intact. Normal bone mineralization. No worrisome osseous lesions. Soft tissues and spinal canal: Extensive soft tissue gas is seen throughout the soft tissues of the base of the neck extending throughout the musculature both anterior and posteriorly and within the retropharyngeal spaces. This is most likely related to a pneumomediastinum described the chest sections below. No prevertebral fluid swelling or hemorrhage. No visible canal hematoma. Disc levels: No significant central canal or foraminal stenosis identified within the imaged levels of the spine. Other: Multiple carious lesions and periapical lucencies with a partially included dentition. CT CHEST FINDINGS Cardiovascular: The aortic root and ascending aorta is suboptimally assessed given cardiac pulsation artifact. No acute luminal abnormality of the imaged aorta. No focal periaortic stranding or hemorrhage. Left vertebral artery arises directly from the aortic arch. Proximal great vessels as included are unremarkable. Central pulmonary arteries are caliber without large central filling defect. More distal evaluation limited on  this non tailored exam. No major venous abnormalities are seen. Mediastinum/Nodes: Pneumomediastinum is seen predominantly towards the thoracic inlet and in the posterior mediastinum. Some wedge-shaped soft tissue attenuation is seen draping over the anterior mediastinum which could reflect thymic remnant though contusion is not excluded given extensive traumatic findings in the chest. No worrisome mediastinal or hilar nodes. Thyroid gland is unremarkable. No acute traumatic abnormality of the thoracic esophagus. No clear discontinuity or evidence of direct injury to the trachea. Lungs/Pleura: Moderate left pneumothorax with small volume of layering hemothorax. Atelectatic changes are present with additional areas of more focal consolidative opacity and tiny air lucencies likely reflecting a combination of pulmonary contusion and laceration/traumatic pneumatocele related to the multitude of left-sided rib fractures, predominantly seen in the posterior left lower lobe. Some dependent atelectatic changes are present in the right lung as well though mild underlying contusion is not fully excluded. Trace right pneumothorax is seen along the azygoesophageal recess. Musculoskeletal: Minimally displaced anterolateral fractures of the left second through eighth rib. Additional posterolateral displaced fractures the fifth through ninth ribs reflecting segmental injury.  Minimally displaced fractures of the right second and third ribs as well. Sternum and manubrium are intact. Scapula are intact. Clavicles are intact aside albeit with slight asymmetric widening of the right sternoclavicular joint. Extensive subcutaneous emphysema and gas extending across the chest wall, base of the neck, anterior and posterior upper thoracic soft tissues, the soft tissues of the left breast and through the left chest wall, flank and abdominal soft tissues. There is slight asymmetric thickening of the right diaphragmatic crura. CT ABDOMEN  PELVIS FINDINGS Hepatobiliary: Band of hypoattenuation is seen within the posterior left lobe liver adjacent the falciform ligament which could reflect small liver laceration measuring up to 2 cm in depth (6/354, coronal 4/41). Trace hemorrhage is seen at the tip of the right lobe liver. Focal fatty infiltration noted at the falciform ligament. Concerning focal liver lesion. Prior cholecystectomy. Normal biliary tree without visible calcified gallstone. Pancreas: No visible pancreatic contusion or ductal disruption. No pancreatic ductal dilatation or surrounding inflammatory changes. Spleen: At least 2 lacerations in the inferior spleen, each measuring up to approximately 2.5 cm in depth with a subcapsular hematoma extending over approximately 40% of the surface area of the spleen itself with minimal intraperitoneal extension. No contained vascular injury is evident. No active contrast extravasation. Adrenals/Urinary Tract: No evidence of adrenal hemorrhage nor suspicious adrenal lesion. No direct renal injury is identified though there is a small amount of trace fluid/possible hemorrhage in the left anterior pararenal space. Kidneys enhance and excrete symmetrically. No concerning focal renal lesion. No urolithiasis or hydronephrosis. Bladder decompressed by Foley catheter. No convincing CT evidence of direct bladder injury at this time. Stomach/Bowel: Distal esophagus and stomach are unremarkable. There is a small amount stranding and thickening near the duodenal bulb in the right upper quadrant (6/423). No other small bowel thickening or dilatation. Cecum displaced to the midline pelvis. Appendix not well visualized. No colonic dilatation or wall thickening. Vascular/Lymphatic: No visible direct vascular injury in the abdomen or pelvis. No sites of active contrast extravasation are seen. No suspicious or enlarged lymph nodes in the included lymphatic chains. Reproductive: Anteverted uterus with radiopaque IUD in  expected positioning. Heterogeneous enhancement of the uterus is likely related to contrast timing. No concerning adnexal lesions. Other: Nonspecific stranding and trace fluid is seen in the mid mesentery to the left of midline (6/47), could reflect a small mesenteric contusion. Additional focus of soft tissue stranding and likely hemorrhage is seen adjacent the left iliacus predominantly within the extra peritoneum though some slight intraperitoneal extension may be present (6/595). Hemoperitoneum seen layering in the right pericolic gutter tracking inferiorly from the tip of the liver. Small volume hemoperitoneum layering in the deep pelvis as well. No free air within the abdomen or pelvis. No traumatic abdominal wall dehiscence. Though there is thickening and stranding adjacent the left inferior lumbar triangle adjacent the iliac wing fracture, as described below. Gas extending across the abdominal wall and left flank and posterior soft tissues. Musculoskeletal: Comminuted fracture left iliac crest with associated intramuscular hemorrhage within the iliacus as well as the gluteal musculature. Additional predominantly subtrochanteric left femur fracture with varus angulation across the fracture line and significant external rotation of the fracture fragment containing the articular surface of the femoral head which is normally located within the acetabulum. Minimally displaced fracture of the left pubic body as well. Significant swelling and intramuscular hemorrhage within the proximal compartments of the thigh adjacent fifth femur fracture without discernible active contrast extravasation. Small amount of extraperitoneal hemorrhage adjacent  the left pubic body fracture. IMPRESSION: CT head: 1. Left frontoparietal scalp swelling and trace crescentic hematoma measuring up to 3 mm in maximal thickness. 2. No calvarial fracture. 3. No acute intracranial abnormality. CT cervical spine: 1. Fracture of the medial right  occipital condyle. 2. No other skull base or cervical spine fracture or traumatic listhesis. 3. Soft tissue gas tracking at the base of the neck and suboccipital soft tissues tracking from more extensive gas in the chest wall likely related to multitude of rib fractures detailed below. CT chest, abdomen and pelvis: 1. Left second through ninth rib fractures including additional posterior fractures of the fifth through ninth ribs reflecting segmental injury. Correlate for flail chest physiology. Additional minimally displaced right second and third anterior rib fractures. 2. Moderate left hemo pneumothorax with areas of pulmonary contusion and laceration predominantly in the left lower lobe. Trace right pneumothorax present as well. Pneumomediastinum is present predominantly within the thoracic inlet and in the posterior mediastinum. 3. Extensive soft tissue gas throughout the soft tissues of the base of the neck, chest wall, left flank and posterior abdominal soft tissues. 4. AAST grade 2 liver injury: 2 cm laceration in the posterior left lobe liver adjacent the falciform ligament with small amount of adjacent subcapsular hematoma and hemorrhage tracking from the liver tip to the pericolic gutter. No active contrast extravasation. 5. AST grade 3 splenic injury: At least 2 small lacerations involving the inferior pole of the spleen each measuring up to 2.5 cm in depth with a subcapsular hematoma extending across approximately 40% of the surface area. Minimal extension into the peritoneum. No active contrast extravasation. 6. Mild asymmetric thickening of the right diaphragmatic crura,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

## 2020-05-25 NOTE — Discharge Instructions (Addendum)
Wear neck collar until cleared by Dr. Franky Macho (neurosurgery to remove)   PNEUMOTHORAX OR HEMOTHORAX +/- RIB FRACTURES  HOME INSTRUCTIONS   1. PAIN CONTROL:  1. Pain is best controlled by a usual combination of three different methods TOGETHER:  i. Ice/Heat ii. Over the counter pain medication iii. Prescription pain medication 2. You may experience some swelling and bruising in area of broken ribs. Ice packs or heating pads (30-60 minutes up to 6 times a day) will help. Use ice for the first few days to help decrease swelling and bruising, then switch to heat to help relax tight/sore spots and speed recovery. Some people prefer to use ice alone, heat alone, alternating between ice & heat. Experiment to what works for you. Swelling and bruising can take several weeks to resolve.  3. It is helpful to take an over-the-counter pain medication regularly for the first few weeks. Choose one of the following that works best for you:  i. Naproxen (Aleve, etc) Two 220mg  tabs twice a day ii. Ibuprofen (Advil, etc) Three 200mg  tabs four times a day (every meal & bedtime) iii. Acetaminophen (Tylenol, etc) 500-650mg  four times a day (every meal & bedtime) 4. A prescription for pain medication (such as oxycodone, hydrocodone, etc) may be given to you upon discharge. Take your pain medication as prescribed.  i. If you are having problems/concerns with the prescription medicine (does not control pain, nausea, vomiting, rash, itching, etc), please call [phone] to see if we need to switch you to a different pain medicine that will work better for you and/or control your side effect better. ii. If you need a refill on your pain medication, please contact your pharmacy. They will contact our office to request authorization. Prescriptions will not be filled after 5 pm or on week-ends. 1. Avoid getting constipated. When taking pain medications, it is common to experience some constipation. Increasing fluid  intake and taking a fiber supplement (such as Metamucil, Citrucel, FiberCon, MiraLax, etc) 1-2 times a day regularly will usually help prevent this problem from occurring. A mild laxative (prune juice, Milk of Magnesia, MiraLax, etc) should be taken according to package directions if there are no bowel movements after 48 hours.  2. Watch out for diarrhea. If you have many loose bowel movements, simplify your diet to bland foods & liquids for a few days. Stop any stool softeners and decrease your fiber supplement. Switching to mild anti-diarrheal medications (Kayopectate, Pepto Bismol) can help. If this worsens or does not improve, please call us. 3. Chest tube site wound: you may remove the dressing from your chest tube site 3 days after the removal of your chest tube. DO NOT shower over the dressing. Once   removed, you may shower as normal. Do not submerge your wound in water for 2-3 weeks.  4. FOLLOW UP  a. Please call our office to set up or confirm an appointment for follow up for 2 weeks after discharge. You will need to get a chest xray at either [facility] or [facility]. This will be outlined in your follow up instructions. Please call [facility] at [phone] if you have any questions about follow up.  b. If you have any orthopedic or other injuries you will need to follow up as outlined in your follow up instructions.   WHEN TO CALL [facility] [phone]:  1. Poor pain control 2. Reactions / problems with new medications (rash/itching, nausea, etc)  3. Fever over 101.5 F (38.5 C)  4. Worsening swelling or bruising 5. Redness, drainage, pain or swelling around chest tube site 6. Worsening pain, productive cough, difficulty breathing or any other concerning symptoms  The clinic staff is available to answer your questions during regular business hours (8:30am-5pm). Please don't hesitate to call and ask to speak to one of our nurses for clinical concerns.  If you have a medical emergency,  go to the nearest emergency room or call 911.  A surgeon from [facility] is always on call at the [facility], [place]  [address], [address], [place], [place] [address] ?  MAIN: [phone] ? TOLL FREE: [phone] ?  FAX [phone]  [email or web address]      Information on Rib Fractures  A rib fracture is a break or crack in one of the bones of the ribs. The ribs are long, curved bones that wrap around your chest and attach to your spine and your breastbone. The ribs protect your heart, lungs, and other organs in the chest. A broken or cracked rib is often painful but is not usually serious. Most rib fractures heal on their own over time. However, rib fractures can be more serious if multiple ribs are broken or if broken ribs move out of place and push against other structures or organs. What are the causes? This condition is caused by:  Repetitive movements with high force, such as pitching a baseball or having severe coughing spells.  A direct blow to the chest, such as a sports injury, a car accident, or a fall.  Cancer that has spread to the bones, which can weaken bones and cause them to break. What are the signs or symptoms? Symptoms of this condition include:  Pain when you breathe in or cough.  Pain when someone presses on the injured area.  Feeling short of breath. How is this diagnosed? This condition is diagnosed with a physical exam and medical history. Imaging tests may also be done, such as:  Chest X-ray.  CT scan.  MRI.  Bone scan.  Chest ultrasound. How is this treated? Treatment for this condition depends on the severity of the fracture. Most rib fractures usually heal on their own in 1-3 months. Sometimes healing takes longer if there is a cough that does not stop or if there are other activities that make the injury worse (aggravating factors). While you heal, you will be given medicines  to control the pain. You will also be taught deep breathing exercises. Severe injuries may require hospitalization or surgery. Follow these instructions at home: Managing pain, stiffness, and swelling  If directed, apply ice to the injured area. ? Put ice in a plastic bag. ? Place a towel between your skin and the bag. ? Leave the ice on for 20 minutes, 2-3 times a day.  Take over-the-counter and prescription medicines only as told by your health care provider. Activity  Avoid a lot of activity and any activities or movements that cause pain. Be careful during activities and avoid bumping the injured rib.  Slowly increase your activity as told by your health care provider. General instructions  Do deep breathing exercises as told by your health care provider. This helps prevent pneumonia, which is a common complication of a broken rib. Your health care provider may instruct you to: ? Take deep breaths several times a day. ? Try to cough several times a day, holding a pillow against the injured area. ? Use a device called  incentive spirometer to practice deep breathing several times a day.  Drink enough fluid to keep your urine pale yellow.  Do not wear a rib belt or binder. These restrict breathing, which can lead to pneumonia.  Keep all follow-up visits as told by your health care provider. This is important. Contact a health care provider if:  You have a fever. Get help right away if:  You have difficulty breathing or you are short of breath.  You develop a cough that does not stop, or you cough up thick or bloody sputum.  You have nausea, vomiting, or pain in your abdomen.  Your pain gets worse and medicine does not help. Summary  A rib fracture is a break or crack in one of the bones of the ribs.  A broken or cracked rib is often painful but is not usually serious.  Most rib fractures heal on their own over time.  Treatment for this condition depends on the  severity of the fracture.  Avoid a lot of activity and any activities or movements that cause pain. This information is not intended to replace advice given to you by your health care provider. Make sure you discuss any questions you have with your health care provider. Document Released: 06/24/2005 Document Revised: 09/23/2016 Document Reviewed: 09/23/2016 Elsevier Interactive Patient Education  2019 [employer]    Pneumothorax A pneumothorax is commonly called a collapsed lung. It is a condition in which air leaks from a lung and builds up between the thin layer of tissue that covers the lungs (visceral pleura) and the interior wall of the chest cavity (parietal pleura). The air gets trapped outside the lung, between the lung and the chest wall (pleural space). The air takes up space and prevents the lung from fully expanding. This condition sometimes occurs suddenly with no apparent cause. The buildup of air may be small or large. A small pneumothorax may go away on its own. A large pneumothorax will require treatment and hospitalization. What are the causes? This condition may be caused by:  Trauma and injury to the chest wall.  Surgery and other medical procedures.  A complication of an underlying lung problem, especially chronic obstructive pulmonary disease (COPD) or emphysema. Sometimes the cause of this condition is not known. What increases the risk? You are more likely to develop this condition if:  You have an underlying lung problem.  You smoke.  You are 13-49 years old, female, tall, and underweight.  You have a personal or family history of pneumothorax.  You have an eating disorder (anorexia nervosa). This condition can also happen quickly, even in people with no history of lung problems. What are the signs or symptoms? Sometimes a pneumothorax will have no symptoms. When symptoms are present, they can include:  Chest pain.  Shortness of breath.  Increased  rate of breathing.  Bluish color to your lips or skin (cyanosis). How is this diagnosed? This condition may be diagnosed by:  A medical history and physical exam.  A chest X-ray, chest CT scan, or ultrasound. How is this treated? Treatment depends on how severe your condition is. The goal of treatment is to remove the extra air and allow your lung to expand back to its normal size.  For a small pneumothorax: ? No treatment may be needed. ? Extra oxygen is sometimes used to make it go away more quickly.  For a large pneumothorax or a pneumothorax that is causing symptoms, a procedure is done to drain  the air from your lungs. To do this, a health care provider may use: ? A needle with a syringe. This is used to suck air from a pleural space where no additional leakage is taking place. ? A chest tube. This is used to suck air where there is ongoing leakage into the pleural space. The chest tube may need to remain in place for several days until the air leak has healed.  In more severe cases, surgery may be needed to repair the damage that is causing the leak.  If you have multiple pneumothorax episodes or have an air leak that will not heal, a procedure called a pleurodesis may be done. A medicine is placed in the pleural space to irritate the tissues around the lung so that the lung will stick to the chest wall, seal any leaks, and stop any buildup of air in that space. If you have an underlying lung problem, severe symptoms, or a large pneumothorax you will usually need to stay in the hospital. Follow these instructions at home: Lifestyle  Do not use any products that contain nicotine or tobacco, such as cigarettes and e-cigarettes. These are major risk factors in pneumothorax. If you need help quitting, ask your health care provider.  Do not lift anything that is heavier than 10 lb (4.5 kg), or the limit that your health care provider tells you, until he or she says that it is  safe.  Avoid activities that take a lot of effort (strenuous) for as long as told by your health care provider.  Return to your normal activities as told by your health care provider. Ask your health care provider what activities are safe for you.  Do not fly in an airplane or scuba dive until your health care provider says it is okay. General instructions  Take over-the-counter and prescription medicines only as told by your health care provider.  If a cough or pain makes it difficult for you to sleep at night, try sleeping in a semi-upright position in a recliner or by using 2 or 3 pillows.  If you had a chest tube and it was removed, ask your health care provider when you can remove the bandage (dressing). While the dressing is in place, do not allow it to get wet.  Keep all follow-up visits as told by your health care provider. This is important. Contact a health care provider if:  You cough up thick mucus (sputum) that is yellow or green in color.  You were treated with a chest tube, and you have redness, increasing pain, or discharge at the site where it was placed. Get help right away if:  You have increasing chest pain or shortness of breath.  You have a cough that will not go away.  You begin coughing up blood.  You have pain that is getting worse or is not controlled with medicines.  The site where your chest tube was located opens up.  You feel air coming out of the site where the chest tube was placed.  You have a fever or persistent symptoms for more than 2-3 days.  You have a fever and your symptoms suddenly get worse. These symptoms may represent a serious problem that is an emergency. Do not wait to see if the symptoms will go away. Get medical help right away. Call your local emergency services (911 in the [place] Do not drive yourself to the hospital. Summary  A pneumothorax, commonly called a collapsed lung, is a  condition in which air leaks from a lung and  gets trapped between the lung and the chest wall (pleural space).  The buildup of air may be small or large. A small pneumothorax may go away on its own. A large pneumothorax will require treatment and hospitalization.  Treatment for this condition depends on how severe the pneumothorax is. The goal of treatment is to remove the extra air and allow the lung to expand back to its normal size. This information is not intended to replace advice given to you by your health care provider. Make sure you discuss any questions you have with your health care provider. Document Released: 06/24/2005 Document Revised: 06/02/2017 Document Reviewed: 06/02/2017 Elsevier Interactive Patient Education  2019 [employer]    PLAN following orthopedic surgery: Weightbearing: Weightbearing as tolerated to left lower extremity Incisional and dressing care: Remove dressings and leave incisions open to air Showering: Okay to begin showering with assistance  Follow - up plan: 2 weeks after hospital discharge for repeat x-rays and wound check with Dr. Jena GaussHaddix

## 2020-05-25 NOTE — Anesthesia Postprocedure Evaluation (Signed)
Anesthesia Post Note  Patient: Linda Jackson  Procedure(s) Performed: INTRAMEDULLARY (IM) NAIL FEMORAL (Left )     Patient location during evaluation: PACU Anesthesia Type: General Level of consciousness: awake and alert Pain management: pain level controlled Vital Signs Assessment: post-procedure vital signs reviewed and stable Respiratory status: spontaneous breathing, nonlabored ventilation, respiratory function stable and patient connected to nasal cannula oxygen Cardiovascular status: blood pressure returned to baseline and stable Postop Assessment: no apparent nausea or vomiting Anesthetic complications: no   No complications documented.  Last Vitals:  Vitals:   05/25/20 1353 05/25/20 1354  BP: 110/72   Pulse: 99 96  Resp: 10 13  Temp:  37.1 C  SpO2: 93% 94%                  Beryle Lathe

## 2020-05-25 NOTE — Progress Notes (Signed)
Patient has a bed on [address]20. Report called to [facility] in ICU. Patient remains in the OR at this time.
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
## 2020-05-25 NOTE — Transfer of Care (Signed)
Immediate Anesthesia Transfer of Care Note  Patient: Linda Jackson  Procedure(s) Performed: INTRAMEDULLARY (IM) NAIL FEMORAL (Left )  Patient Location: PACU  Anesthesia Type:General  Level of Consciousness: patient cooperative and responds to stimulation  Airway & Oxygen Therapy: Patient Spontanous Breathing and Patient connected to face mask oxygen  Post-op Assessment: Report given to RN and Post -op Vital signs reviewed and stable  Post vital signs: Reviewed and stable  Last Vitals:  Vitals Value Taken Time  BP 109/65 05/25/20 1238  Temp    Pulse 126 05/25/20 1248  Resp 19 05/25/20 1248  SpO2 94 % 05/25/20 1248  Vitals shown include unvalidated device data.  Last Pain:  Vitals:   05/25/20 1237  TempSrc:   PainSc: (P) Asleep      Patients Stated Pain Goal: 3 (05/25/20 0754)  Complications: No complications documented.

## 2020-05-25 NOTE — Consult Note (Signed)
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 hematoma measuring up to 3 mm in maximal thickness. Sinuses/Orbits: Subtotal opacification of the maxillary sinuses with pneumatized secretions. Additional thickening in the ethmoids and right sphenoid sinus. Mastoid air cells are predominantly clear. Middle ear cavities are clear. Ossicular chains are grossly normal in configuration. Included orbital structures are unremarkable. Other: Soft tissue gas is seen tracking superiorly within the suboccipital soft tissues and deep spaces of the left neck. CT CERVICAL FINDINGS Alignment: Cervical stabilization collar is absent at the time of examination. No evidence of traumatic listhesis. No abnormally widened, perched or jumped facets. Normal alignment of the craniocervical and atlantoaxial articulations. Skull base and vertebrae: Fracture of the medial right occipital condyle. No other visible skull base  fracture. No vertebral body fracture or height loss is seen. Posterior elements remain intact. Normal bone mineralization. No worrisome  osseous lesions. Soft tissues and spinal canal: Extensive soft tissue gas is seen throughout the soft tissues of the base of the neck extending throughout the musculature both anterior and posteriorly and within the retropharyngeal spaces. This is most likely related to a pneumomediastinum described the chest sections below. No prevertebral fluid swelling or hemorrhage. No visible canal hematoma. Disc levels: No significant central canal or foraminal stenosis identified within the imaged levels of the spine. Other: Multiple carious lesions and periapical lucencies with a partially included dentition. CT CHEST FINDINGS Cardiovascular: The aortic root and ascending aorta is suboptimally assessed given cardiac pulsation artifact. No acute luminal abnormality of the imaged aorta. No focal periaortic stranding or hemorrhage. Left vertebral artery arises directly from the aortic arch. Proximal great vessels as included are unremarkable. Central pulmonary arteries are caliber without large central filling defect. More distal evaluation limited on this non tailored exam. No major venous abnormalities are seen. Mediastinum/Nodes: Pneumomediastinum is seen predominantly towards the thoracic inlet and in the posterior mediastinum. Some wedge-shaped soft tissue attenuation is seen draping over the anterior mediastinum which could reflect thymic remnant though contusion is not excluded given extensive traumatic findings in the chest. No worrisome mediastinal or hilar nodes. Thyroid gland is unremarkable. No acute traumatic abnormality of the thoracic esophagus. No clear discontinuity or evidence of direct injury to the trachea. Lungs/Pleura: Moderate left pneumothorax with small volume of layering hemothorax. Atelectatic changes are present with additional areas of more focal consolidative  opacity and tiny air lucencies likely reflecting a combination of pulmonary contusion and laceration/traumatic pneumatocele related to the multitude of left-sided rib fractures, predominantly seen in the posterior left lower lobe. Some dependent atelectatic changes are present in the right lung as well though mild underlying contusion is not fully excluded. Trace right pneumothorax is seen along the azygoesophageal recess. Musculoskeletal: Minimally displaced anterolateral fractures of the left second through eighth rib. Additional posterolateral displaced fractures the fifth through ninth ribs reflecting segmental injury. Minimally displaced fractures of the right second and third ribs as well. Sternum and manubrium are intact. Scapula are intact. Clavicles are intact aside albeit with slight asymmetric widening of the right sternoclavicular joint. Extensive subcutaneous emphysema and gas extending across the chest wall, base of the neck, anterior and posterior upper thoracic soft tissues, the soft tissues of the left breast and through the left chest wall, flank and abdominal soft tissues. There is slight asymmetric thickening of the right diaphragmatic crura. CT ABDOMEN PELVIS FINDINGS Hepatobiliary: Band of hypoattenuation is seen within the posterior left lobe liver adjacent the falciform ligament which could reflect small liver laceration measuring up to 2 cm in depth (6/354, coronal 4/41). Trace hemorrhage is seen at the tip of the right lobe liver. Focal fatty infiltration noted at the falciform ligament. Concerning focal liver lesion. Prior cholecystectomy. Normal biliary tree without visible calcified gallstone. Pancreas: No visible pancreatic contusion or ductal disruption. No pancreatic ductal dilatation or surrounding inflammatory changes. Spleen: At least 2 lacerations in the inferior spleen, each measuring up to approximately 2.5 cm in depth with a subcapsular hematoma extending over approximately 40%  of the surface area of the spleen itself with minimal intraperitoneal extension. No contained vascular injury is evident. No active contrast extravasation. Adrenals/Urinary Tract: No evidence of adrenal hemorrhage nor suspicious adrenal lesion. No direct renal injury is identified though there is a small amount of trace fluid/possible hemorrhage in the left anterior pararenal space. Kidneys enhance and excrete symmetrically. No concerning focal renal lesion. No urolithiasis  or hydronephrosis. Bladder decompressed by Foley catheter. No convincing CT evidence of direct bladder injury at this time. Stomach/Bowel: Distal esophagus and stomach are unremarkable. There is a small amount stranding and thickening near the duodenal bulb in the right upper quadrant (6/423). No other small bowel thickening or dilatation. Cecum displaced to the midline pelvis. Appendix not well visualized. No colonic dilatation or wall thickening. Vascular/Lymphatic: No visible direct vascular injury in the abdomen or pelvis. No sites of active contrast extravasation are seen. No suspicious or enlarged lymph nodes in the included lymphatic chains. Reproductive: Anteverted uterus with radiopaque IUD in expected positioning. Heterogeneous enhancement of the uterus is likely related to contrast timing. No concerning adnexal lesions. Other: Nonspecific stranding and trace fluid is seen in the mid mesentery to the left of midline (6/47), could reflect a small mesenteric contusion. Additional focus of soft tissue stranding and likely hemorrhage is seen adjacent the left iliacus predominantly within the extra peritoneum though some slight intraperitoneal extension may be present (6/595). Hemoperitoneum seen layering in the right pericolic gutter tracking inferiorly from the tip of the liver. Small volume hemoperitoneum layering in the deep pelvis as well. No free air within the abdomen or pelvis. No traumatic abdominal wall dehiscence. Though there is  thickening and stranding adjacent the left inferior lumbar triangle adjacent the iliac wing fracture, as described below. Gas extending across the abdominal wall and left flank and posterior soft tissues. Musculoskeletal: Comminuted fracture left iliac crest with associated intramuscular hemorrhage within the iliacus as well as the gluteal musculature. Additional predominantly subtrochanteric left femur fracture with varus angulation across the fracture line and significant external rotation of the fracture fragment containing the articular surface of the femoral head which is normally located within the acetabulum. Minimally displaced fracture of the left pubic body as well. Significant swelling and intramuscular hemorrhage within the proximal compartments of the thigh adjacent fifth femur fracture without discernible active contrast extravasation. Small amount of extraperitoneal hemorrhage adjacent the left pubic body fracture. IMPRESSION: CT head: 1. Left frontoparietal scalp swelling and trace crescentic hematoma measuring up to 3 mm in maximal thickness. 2. No calvarial fracture. 3. No acute intracranial abnormality. CT cervical spine: 1. Fracture of the medial right occipital condyle. 2. No other skull base or cervical spine fracture or traumatic listhesis. 3. Soft tissue gas tracking at the base of the neck and suboccipital soft tissues tracking from more extensive gas in the chest wall likely related to multitude of rib fractures detailed below. CT chest, abdomen and pelvis: 1. Left second through ninth rib fractures including additional posterior fractures of the fifth through ninth ribs reflecting segmental injury. Correlate for flail chest physiology. Additional minimally displaced right second and third anterior rib fractures. 2. Moderate left hemo pneumothorax with areas of pulmonary contusion and laceration predominantly in the left lower lobe. Trace right pneumothorax present as well.  Pneumomediastinum is present predominantly within the thoracic inlet and in the posterior mediastinum. 3. Extensive soft tissue gas throughout the soft tissues of the base of the neck, chest wall, left flank and posterior abdominal soft tissues. 4. AAST grade 2 liver injury: 2 cm laceration in the posterior left lobe liver adjacent the falciform ligament with small amount of adjacent subcapsular hematoma and hemorrhage tracking from the liver tip to the pericolic gutter. No active contrast extravasation. 5. AST grade 3 splenic injury: At least 2 small lacerations involving the inferior pole of the spleen each measuring up to 2.5 cm in depth with a subcapsular  hematoma extending across approximately 40% of the surface area. Minimal extension into the peritoneum. No active contrast extravasation. 6. Mild asymmetric thickening of the right diaphragmatic crura, could reflect a mild diaphragmatic injury. 7. Small amount of nonspecific stranding in the left anterior pararenal space, could reflect contusion or distributed retroperitoneal hemorrhage. 8. Small mesenteric contusion/in the left upper quadrant without active contrast extravasation. 9. Predominantly subtrochanteric left femur fracture with significant rotation of the proximal femur fragment which remains normally seated in the acetabulum and varus angulation across the fracture line. Extensive surrounding thigh hematoma and intramuscular hemorrhage. 10. Comminuted fracture of the left iliac crest with surrounding intramuscular hemorrhage of the iliacus and gluteal musculature, stranding and thickening in the inferior lumbar triangle without frank dehiscence or herniation. 11. Minimally displaced fracture of the left pubic body with trace extraperitoneal hemorrhage. 12. Wedge-shaped soft tissue in the anterior mediastinum, possibly thymic remnant though cannot exclude small mediastinal hematoma given extensive traumatic findings. These results were called by  telephone at the time of interpretation on 05/25/2020 at 2:44 am to provider Dr. Clayborne Dana, who verbally acknowledged these results. Electronically Signed   By: Kreg Shropshire M.D.   On: 05/25/2020 02:44   CT CERVICAL SPINE WO CONTRAST  Result Date: 05/25/2020 CLINICAL DATA:  Larey Seat into ditch, unclear history EXAM: CT HEAD WITHOUT CONTRAST CT CERVICAL SPINE WITHOUT CONTRAST CT CHEST, ABDOMEN AND PELVIS WITH CONTRAST TECHNIQUE: Contiguous axial images were obtained from the base of the skull through the vertex without intravenous contrast. Multidetector CT imaging of the cervical spine was performed without intravenous contrast. Multiplanar CT image reconstructions were also generated. Multidetector CT imaging of the chest, abdomen and pelvis was performed following the standard protocol during bolus administration of intravenous contrast. CONTRAST:  OMNIPAQUE IOHEXOL 300 MG/ML  SOLN COMPARISON:  CT abdomen and pelvis 01/19/2017, CT head 06/15/2003 FINDINGS: CT HEAD FINDINGS Brain: No evidence of acute infarction, hemorrhage, hydrocephalus, extra-axial collection. Scattered benign dural calcifications. 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 No new worrisome masses or mass effect. Vascular: No hyperdense vessel or unexpected calcification. Skull: No calvarial fracture or suspicious osseous lesion. No scalp swelling or hematoma. Left frontoparietal scalp swelling and trace crescentic hematoma measuring up to 3 mm in maximal thickness. Sinuses/Orbits: Subtotal opacification of the maxillary sinuses with pneumatized secretions. Additional thickening in the ethmoids and right sphenoid sinus. Mastoid air cells are predominantly clear. Middle ear cavities are clear. Ossicular chains are grossly normal in configuration. Included orbital structures are unremarkable. Other: Soft tissue gas is seen tracking superiorly within the suboccipital soft tissues and deep spaces of the  left neck. CT CERVICAL FINDINGS Alignment: Cervical stabilization collar is absent at the time of examination. No evidence of traumatic listhesis. No abnormally widened, perched or jumped facets. Normal alignment of the craniocervical and atlantoaxial articulations. Skull base and vertebrae: Fracture of the medial right occipital condyle. No other visible skull base fracture. No vertebral body fracture or height loss is seen. Posterior elements remain intact. Normal bone mineralization. No worrisome osseous lesions. Soft tissues and spinal canal: Extensive soft tissue gas is seen throughout the soft tissues of the base of the neck extending throughout the musculature both anterior and posteriorly and within the retropharyngeal spaces. This is most likely related to a pneumomediastinum described the chest sections below. No prevertebral fluid swelling or hemorrhage. No visible canal hematoma. Disc levels: No significant central canal or foraminal stenosis identified within the imaged levels of the spine. Other: Multiple  carious lesions and periapical lucencies with a partially included dentition. CT CHEST FINDINGS Cardiovascular: The aortic root and ascending aorta is suboptimally assessed given cardiac pulsation artifact. No acute luminal abnormality of the imaged aorta. No focal periaortic stranding or hemorrhage. Left vertebral artery arises directly from the aortic arch. Proximal great vessels as included are unremarkable. Central pulmonary arteries are caliber without large central filling defect. More distal evaluation limited on this non tailored exam. No major venous abnormalities are seen. Mediastinum/Nodes: Pneumomediastinum is seen predominantly towards the thoracic inlet and in the posterior mediastinum. Some wedge-shaped soft tissue attenuation is seen draping over the anterior mediastinum which could reflect thymic remnant though contusion is not excluded given extensive traumatic findings in the chest.  No worrisome mediastinal or hilar nodes. Thyroid gland is unremarkable. No acute traumatic abnormality of the thoracic esophagus. No clear discontinuity or evidence of direct injury to the trachea. Lungs/Pleura: Moderate left pneumothorax with small volume of layering hemothorax. Atelectatic changes are present with additional areas of more focal consolidative opacity and tiny air lucencies likely reflecting a combination of pulmonary contusion and laceration/traumatic pneumatocele related to the multitude of left-sided rib fractures, predominantly seen in the posterior left lower lobe. Some dependent atelectatic changes are present in the right lung as well though mild underlying contusion is not fully excluded. Trace right pneumothorax is seen along the azygoesophageal recess. Musculoskeletal: Minimally displaced anterolateral fractures of the left second through eighth rib. Additional posterolateral displaced fractures the fifth through ninth ribs reflecting segmental injury. Minimally displaced fractures of the right second and third ribs as well. Sternum and manubrium are intact. Scapula are intact. Clavicles are intact aside albeit with slight asymmetric widening of the right sternoclavicular joint. Extensive subcutaneous emphysema and gas extending across the chest wall, base of the neck, anterior and posterior upper thoracic soft tissues, the soft tissues of the left breast and through the left chest wall, flank and abdominal soft tissues. There is slight asymmetric thickening of the right diaphragmatic crura. CT ABDOMEN PELVIS FINDINGS Hepatobiliary: Band of hypoattenuation is seen within the posterior left lobe liver adjacent the falciform ligament which could reflect small liver laceration measuring up to 2 cm in depth (6/354, coronal 4/41). Trace hemorrhage is seen at the tip of the right lobe liver. Focal fatty infiltration noted at the falciform ligament. Concerning focal liver lesion. Prior  cholecystectomy. Normal biliary tree without visible calcified gallstone. Pancreas: No visible pancreatic contusion or ductal disruption. No pancreatic ductal dilatation or surrounding inflammatory changes. Spleen: At least 2 lacerations in the inferior spleen, each measuring up to approximately 2.5 cm in depth with a subcapsular hematoma extending over approximately 40% of the surface area of the spleen itself with minimal intraperitoneal extension. No contained vascular injury is evident. No active contrast extravasation. Adrenals/Urinary Tract: No evidence of adrenal hemorrhage nor suspicious adrenal lesion. No direct renal injury is identified though there is a small amount of trace fluid/possible hemorrhage in the left anterior pararenal space. Kidneys enhance and excrete symmetrically. No concerning focal renal lesion. No urolithiasis or hydronephrosis. Bladder decompressed by Foley catheter. No convincing CT evidence of direct bladder injury at this time. Stomach/Bowel: Distal esophagus and stomach are unremarkable. There is a small amount stranding and thickening near the duodenal bulb in the right upper quadrant (6/423). No other small bowel thickening or dilatation. Cecum displaced to the midline pelvis. Appendix not well visualized. No colonic dilatation or wall thickening. Vascular/Lymphatic: No visible direct vascular injury in the abdomen or pelvis. No  sites of active contrast extravasation are seen. No suspicious or enlarged lymph nodes in the included lymphatic chains. Reproductive: Anteverted uterus with radiopaque IUD in expected positioning. Heterogeneous enhancement of the uterus is likely related to contrast timing. No concerning adnexal lesions. Other: Nonspecific stranding and trace fluid is seen in the mid mesentery to the left of midline (6/47), could reflect a small mesenteric contusion. Additional focus of soft tissue stranding and likely hemorrhage is seen adjacent the left iliacus  predominantly within the extra peritoneum though some slight intraperitoneal extension may be present (6/595). Hemoperitoneum seen layering in the right pericolic gutter tracking inferiorly from the tip of the liver. Small volume hemoperitoneum layering in the deep pelvis as well. No free air within the abdomen or pelvis. No traumatic abdominal wall dehiscence. Though there is thickening and stranding adjacent the left inferior lumbar triangle adjacent the iliac wing fracture, as described below. Gas extending across the abdominal wall and left flank and posterior soft tissues. Musculoskeletal: Comminuted fracture left iliac crest with associated intramuscular hemorrhage within the iliacus as well as the gluteal musculature. Additional predominantly subtrochanteric left femur fracture with varus angulation across the fracture line and significant external rotation of the fracture fragment containing the articular surface of the femoral head which is normally located within the acetabulum. Minimally displaced fracture of the left pubic body as well. Significant swelling and intramuscular hemorrhage within the proximal compartments of the thigh adjacent fifth femur fracture without discernible active contrast extravasation. Small amount of extraperitoneal hemorrhage adjacent the left pubic body fracture. IMPRESSION: CT head: 1. Left frontoparietal scalp swelling and trace crescentic hematoma measuring up to 3 mm in maximal thickness. 2. No calvarial fracture. 3. No acute intracranial abnormality. CT cervical spine: 1. Fracture of the medial right occipital condyle. 2. No other skull base or cervical spine fracture or traumatic listhesis. 3. Soft tissue gas tracking at the base of the neck and suboccipital soft tissues tracking from more extensive gas in the chest wall likely related to multitude of rib fractures detailed below. CT chest, abdomen and pelvis: 1. Left second through ninth rib fractures including  additional posterior fractures of the fifth through ninth ribs reflecting segmental injury. Correlate for flail chest physiology. Additional minimally displaced right second and third anterior rib fractures. 2. Moderate left hemo pneumothorax with areas of pulmonary contusion and laceration predominantly in the left lower lobe. Trace right pneumothorax present as well. Pneumomediastinum is present predominantly within the thoracic inlet and in the posterior mediastinum. 3. Extensive soft tissue gas throughout the soft tissues of the base of the neck, chest wall, left flank and posterior abdominal soft tissues. 4. AAST grade 2 liver injury: 2 cm laceration in the posterior left lobe liver adjacent the falciform ligament with small amount of adjacent subcapsular hematoma and hemorrhage tracking from the liver tip to the pericolic gutter. No active contrast extravasation. 5. AST grade 3 splenic injury: At least 2 small lacerations involving the inferior pole of the spleen each measuring up to 2.5 cm in depth with a subcapsular hematoma extending across approximately 40% of the surface area. Minimal extension into the peritoneum. No active contrast extravasation. 6. Mild asymmetric thickening of the right diaphragmatic crura, could reflect a mild diaphragmatic injury. 7. Small amount of nonspecific stranding in the left anterior pararenal space, could reflect contusion or distributed retroperitoneal hemorrhage. 8. Small mesenteric contusion/in the left upper quadrant without active contrast extravasation. 9. Predominantly subtrochanteric left femur fracture with significant rotation of the proximal femur fragment  which remains normally seated in the acetabulum and varus angulation across the fracture line. Extensive surrounding thigh hematoma and intramuscular hemorrhage. 10. Comminuted fracture of the left iliac crest with surrounding intramuscular hemorrhage of the iliacus and gluteal musculature, stranding and  thickening in the inferior lumbar triangle without frank dehiscence or herniation. 11. Minimally displaced fracture of the left pubic body with trace extraperitoneal hemorrhage. 12. Wedge-shaped soft tissue in the anterior mediastinum, possibly thymic remnant though cannot exclude small mediastinal hematoma given extensive traumatic findings. These results were called by telephone at the time of interpretation on 05/25/2020 at 2:44 am to provider Dr. Clayborne Dana, who verbally acknowledged these results. Electronically Signed   By: Kreg Shropshire M.D.   On: 05/25/2020 02:44   CT ABDOMEN PELVIS W CONTRAST  Result Date: 05/25/2020 CLINICAL DATA:  Larey Seat into ditch, unclear history EXAM: CT HEAD WITHOUT CONTRAST CT CERVICAL SPINE WITHOUT CONTRAST CT CHEST, ABDOMEN AND PELVIS WITH CONTRAST TECHNIQUE: Contiguous axial images were obtained from the base of the skull through the vertex without intravenous contrast. Multidetector CT imaging of the cervical spine was performed without intravenous contrast. Multiplanar CT image reconstructions were also generated. Multidetector CT imaging of the chest, abdomen and pelvis was performed following the standard protocol during bolus administration of intravenous contrast. CONTRAST:  OMNIPAQUE IOHEXOL 300 MG/ML  SOLN COMPARISON:  CT abdomen and pelvis 01/19/2017, CT head 06/15/2003 FINDINGS: CT HEAD FINDINGS Brain: No evidence of acute infarction, hemorrhage, hydrocephalus, extra-axial collection. Scattered benign dural calcifications. 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 No new worrisome masses or mass effect. Vascular: No hyperdense vessel or unexpected calcification. Skull: No calvarial fracture or

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
## 2020-05-25 NOTE — ED Notes (Signed)
Patient asking for pain medication at this time. Will notify RN Dorathy Daft

## 2020-05-26 ENCOUNTER — Encounter (HOSPITAL_COMMUNITY): Payer: Self-pay | Admitting: Student

## 2020-05-26 ENCOUNTER — Inpatient Hospital Stay (HOSPITAL_COMMUNITY): Payer: Self-pay

## 2020-05-26 LAB — CBC
HCT: 26 % — ABNORMAL LOW (ref 36.0–46.0)
HCT: 25.8 % — ABNORMAL LOW (ref 36.0–46.0)
Hemoglobin: 8.5 g/dL — ABNORMAL LOW (ref 12.0–15.0)
Hemoglobin: 8.3 g/dL — ABNORMAL LOW (ref 12.0–15.0)
MCH: 31.8 pg (ref 26.0–34.0)
MCH: 31.4 pg (ref 26.0–34.0)
MCHC: 32.7 g/dL (ref 30.0–36.0)
MCHC: 32.2 g/dL (ref 30.0–36.0)
MCV: 97.4 fL (ref 80.0–100.0)
MCV: 97.7 fL (ref 80.0–100.0)
Platelets: 169 10*3/uL (ref 150–400)
Platelets: 158 10*3/uL (ref 150–400)
RBC: 2.67 MIL/uL — ABNORMAL LOW (ref 3.87–5.11)
RBC: 2.64 MIL/uL — ABNORMAL LOW (ref 3.87–5.11)
RDW: 12.3 % (ref 11.5–15.5)
RDW: 12.3 % (ref 11.5–15.5)
WBC: 9.5 10*3/uL (ref 4.0–10.5)
WBC: 8.4 10*3/uL (ref 4.0–10.5)
nRBC: 0 % (ref 0.0–0.2)
nRBC: 0 % (ref 0.0–0.2)

## 2020-05-26 LAB — COMPREHENSIVE METABOLIC PANEL
ALT: 42 U/L (ref 0–44)
AST: 82 U/L — ABNORMAL HIGH (ref 15–41)
Albumin: 2.6 g/dL — ABNORMAL LOW (ref 3.5–5.0)
Alkaline Phosphatase: 59 U/L (ref 38–126)
Anion gap: 3 — ABNORMAL LOW (ref 5–15)
BUN: 8 mg/dL (ref 6–20)
CO2: 26 mmol/L (ref 22–32)
Calcium: 8.1 mg/dL — ABNORMAL LOW (ref 8.9–10.3)
Chloride: 112 mmol/L — ABNORMAL HIGH (ref 98–111)
Creatinine, Ser: 0.62 mg/dL (ref 0.44–1.00)
GFR, Estimated: >60 mL/min (ref >60)
Glucose, Bld: 152 mg/dL — ABNORMAL HIGH (ref 70–99)
Potassium: 4.1 mmol/L (ref 3.5–5.1)
Sodium: 141 mmol/L (ref 135–145)
Total Bilirubin: 0.4 mg/dL (ref 0.3–1.2)
Total Protein: 5.1 g/dL — ABNORMAL LOW (ref 6.5–8.1)

## 2020-05-26 LAB — VITAMIN D 25 HYDROXY (VIT D DEFICIENCY, FRACTURES): Vit D, 25-Hydroxy: 14.72 ng/mL — ABNORMAL LOW (ref 30–100)

## 2020-05-26 MED ORDER — DIPHENHYDRAMINE HCL 25 MG PO CAPS
25.0000 mg | ORAL_CAPSULE | Freq: Four times a day (QID) | ORAL | Status: DC | PRN
  Administered 2020-05-27 – 2020-05-28 (×3): 25 mg via ORAL
  Filled 2020-05-26 (×3): qty 1

## 2020-05-26 MED ORDER — VITAMIN D 25 MCG (1000 UNIT) PO TABS
2000.0000 [IU] | ORAL_TABLET | Freq: Two times a day (BID) | ORAL | Status: DC
  Administered 2020-05-26 – 2020-05-30 (×9): 2000 [IU] via ORAL
  Filled 2020-05-26 (×9): qty 2

## 2020-05-26 MED ORDER — GABAPENTIN 100 MG PO CAPS
100.0000 mg | ORAL_CAPSULE | Freq: Three times a day (TID) | ORAL | Status: DC
  Administered 2020-05-26 – 2020-05-27 (×4): 100 mg via ORAL
  Filled 2020-05-26 (×4): qty 1
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
## 2020-05-26 NOTE — Progress Notes (Signed)
Patient in emotional distress and requesting that I reach out to her cousin, called cousin [name] at number in chart. Cousin states that she most likely can't come to hospital to visit today but plans to visit tomorrow 05/27/2020. Allowed patient to talk to cousin on phone and patient seems more peaceful.  Aris Lot, RN

## 2020-05-26 NOTE — Progress Notes (Signed)
Per pt request, this RN called and updated [name] that pt was transferred to 4NP.   Robina Ade, RN

## 2020-05-26 NOTE — Progress Notes (Signed)
Pt visibly distressed and requesting medication for anxiety. She stated "whenever I need anxiety medicine it's after I talk to my cousin." This RN educated pt to try talking to someone else. Pt says she just has her cousin and her dad. Pt's dad said he would come visit today.   Robina Ade, RN
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
## 2020-05-26 NOTE — Progress Notes (Addendum)
Patient ID: Linda Cirriora B Teel, female   DOB: [birth year], 29 y.o.   MRN: [id number] Follow up - Trauma Critical Care  Patient Details:    Linda Jackson is an 29 y.o. female.  Lines/tubes : Chest Tube 1 Left Pleural (Active)  Status -20 cm H2O 05/25/20 2000  Chest Tube Air Leak None 05/25/20 2000  Patency Intervention Tip/tilt 05/25/20 2000  Drainage Description Serosanguineous 05/25/20 2000  Dressing Status Dry;Intact;Old drainage 05/25/20 2000  Dressing Intervention Dressing reinforced 05/25/20 1400  Site Assessment Clean;Dry;Intact 05/25/20 2000  Surrounding Skin Dry;Intact 05/25/20 2000  Output (mL) 0 mL 05/26/20 0600     Urethral Catheter lisa mckinney (Active)  Indication for Insertion or Continuance of Catheter Peri-operative use for selective surgical procedure - not to exceed 24 hours post-op 05/25/20 2000  Site Assessment Clean;Intact 05/25/20 2000  Catheter Maintenance Bag below level of bladder;Catheter secured;Drainage bag/tubing not touching floor;Insertion date on drainage bag;No dependent loops;Seal intact 05/25/20 2000  Collection Container Standard drainage bag 05/25/20 2000  Securement Method Securing device (Describe) 05/25/20 2000  Urinary Catheter Interventions (if applicable) Unclamped 05/25/20 2000  Output (mL) 775 mL 05/26/20 0600    Microbiology/Sepsis markers: Results for orders placed or performed during the hospital encounter of 05/24/20  Respiratory Panel by RT PCR (Flu A&B, Covid) -     Status: None   Collection Time: 05/24/20  9:13 PM   Specimen: Nasopharyngeal  Result Value Ref Range Status   SARS Coronavirus 2 by RT PCR NEGATIVE NEGATIVE Final    Comment: (NOTE) SARS-CoV-2 target nucleic acids are NOT DETECTED.  The SARS-CoV-2 RNA is generally detectable in upper respiratoy specimens during the acute phase of infection. The lowest concentration of SARS-CoV-2 viral copies this assay can detect is 131 copies/mL. A negative result does not  preclude SARS-Cov-2 infection and should not be used as the sole basis for treatment or other patient management decisions. A negative result may occur with  improper specimen collection/handling, submission of specimen other than nasopharyngeal swab, presence of viral mutation(s) within the areas targeted by this assay, and inadequate number of viral copies (<131 copies/mL). A negative result must be combined with clinical observations, patient history, and epidemiological information. The expected result is Negative.  Fact Sheet for Patients:  https://www.moore.com/https://www.fda.gov/media/142436/download  Fact Sheet for Healthcare Providers:  https://www.young.biz/https://www.fda.gov/media/142435/download  This test is no t yet approved or cleared by the [place] FDA and  has been authorized for detection and/or diagnosis of SARS-CoV-2 by FDA under an Emergency Use Authorization (EUA). This EUA will remain  in effect (meaning this test can be used) for the duration of the COVID-19 declaration under Section 564(b)(1) of the Act, 21 U.S.C. section 360bbb-3(b)(1), unless the authorization is terminated or revoked sooner.     Influenza A by PCR NEGATIVE NEGATIVE Final   Influenza B by PCR NEGATIVE NEGATIVE Final    Comment: (NOTE) The Xpert Xpress SARS-CoV-2/FLU/RSV assay is intended as an aid in  the diagnosis of influenza from Nasopharyngeal swab specimens and  should not be used as a sole basis for treatment. Nasal washings and  aspirates are unacceptable for Xpert Xpress SARS-CoV-2/FLU/RSV  testing.  Fact Sheet for Patients: https://www.moore.com/https://www.fda.gov/media/142436/download  Fact Sheet for Healthcare Providers: https://www.young.biz/https://www.fda.gov/media/142435/download  This test is not yet approved or cleared by the [place] FDA and  has been authorized for detection and/or diagnosis of SARS-CoV-2 by  FDA under an Emergency Use Authorization (EUA). This EUA will remain  in effect (meaning this test can be used) for  the  duration of the  Covid-19 declaration under Section 564(b)(1) of the Act, 21  U.S.C. section 360bbb-3(b)(1), unless the authorization is  terminated or revoked. Performed at [facility], [address] [place], [place] [address]   Surgical pcr screen     Status: Abnormal   Collection Time: 05/25/20  8:52 AM   Specimen: Nasal Mucosa; Nasal Swab  Result Value Ref Range Status   MRSA, PCR POSITIVE (A) NEGATIVE Final    Comment: RESULT CALLED TO, READ BACK BY AND VERIFIED WITH: Kerrin Champagne RN 11:20 05/25/20 ([email or web address])    Staphylococcus aureus POSITIVE (A) NEGATIVE Final    Comment: (NOTE) The Xpert SA Assay (FDA approved for NASAL specimens in patients 64 years of age and older), is one component of a comprehensive surveillance program. It is not intended to diagnose infection nor to guide or monitor treatment. Performed at [facility], [address], [place], [place] [address]     Anti-infectives:  Anti-infectives (From admission, onward)   Start     Dose/Rate Route Frequency Ordered Stop   05/25/20 2000  ceFAZolin (ANCEF) IVPB 2g/100 mL premix        2 g 200 mL/hr over 30 Minutes Intravenous Every 8 hours 05/25/20 1418 05/26/20 1959   05/25/20 1135  vancomycin (VANCOCIN) powder  Status:  Discontinued          As needed 05/25/20 1135 05/25/20 1229   05/25/20 0941  ceFAZolin (ANCEF) 2-4 GM/100ML-% IVPB       Note to Pharmacy: Ernie Avena   : cabinet override      05/25/20 0941 05/25/20 2144      Best Practice/Protocols:  VTE Prophylaxis: Mechanical .  Consults: Treatment Team:  Roby Lofts, MD    Studies:    Events:  Subjective:    Overnight Issues:   Objective:  Vital signs for last 24 hours: Temp:  [97.4 F (36.3 C)-98.9 F (37.2 C)] 98.7 F (37.1 C) (11/19 0400) Pulse Rate:  [62-134] 111 (11/19 0700) Resp:  [10-33] 16 (11/19 0700) BP: (86-121)/(51-78) 103/52 (11/19 0700) SpO2:  [84 %-99 %] 94 % (11/19 0700)  Hemodynamic parameters  for last 24 hours:    Intake/Output from previous day: 11/18 0701 - 11/19 0700 In: 3254.9 [I.V.:2554.9; IV Piggyback:700] Out: 3825 [Urine:3675; Blood:150]  Intake/Output this shift: No intake/output data recorded.  Vent settings for last 24 hours:    Physical Exam:  General: alert and no respiratory distress Neuro: alert and oriented HEENT/Neck: no JVD Resp: clear to auscultation bilaterally CVS: RRR GI: soft, NT Extremities: ace LLE, good DP B  Results for orders placed or performed during the hospital encounter of 05/24/20 (from the past 24 hour(s))  HIV Antibody (routine testing w rflx)     Status: None   Collection Time: 05/25/20  8:07 AM  Result Value Ref Range   HIV Screen 4th Generation wRfx Non Reactive Non Reactive  Type and screen [facility]     Status: None   Collection Time: 05/25/20  8:07 AM  Result Value Ref Range   ABO/RH(D) O POS    Antibody Screen NEG    Sample Expiration      05/28/2020,2359 Performed at [facility], [address] [place], [place] [address]   Surgical pcr screen     Status: Abnormal   Collection Time: 05/25/20  8:52 AM   Specimen: Nasal Mucosa; Nasal Swab  Result Value Ref Range   MRSA, PCR POSITIVE (  A) NEGATIVE   Staphylococcus aureus POSITIVE (A) NEGATIVE  I-STAT, chem 8     Status: Abnormal   Collection Time: 05/25/20  9:54 AM  Result Value Ref Range   Sodium 141 135 - 145 mmol/L   Potassium 4.7 3.5 - 5.1 mmol/L   Chloride 105 98 - 111 mmol/L   BUN 10 6 - 20 mg/dL   Creatinine, Ser 7.35 0.44 - 1.00 mg/dL   Glucose, Bld 97 70 - 99 mg/dL   Calcium, Ion 3.29 9.24 - 1.40 mmol/L   TCO2 25 22 - 32 mmol/L   Hemoglobin 11.6 (L) 12.0 - 15.0 g/dL   HCT 26.8 (L) 36 - 46 %  Glucose, capillary     Status: Abnormal   Collection Time: 05/25/20  4:09 PM  Result Value Ref Range   Glucose-Capillary 151 (H) 70 - 99 mg/dL  CBC     Status: Abnormal   Collection Time: 05/25/20  7:36 PM  Result Value Ref Range    WBC 7.7 4.0 - 10.5 K/uL   RBC 2.98 (L) 3.87 - 5.11 MIL/uL   Hemoglobin 9.2 (L) 12.0 - 15.0 g/dL   HCT 34.1 (L) 36 - 46 %   MCV 96.0 80.0 - 100.0 fL   MCH 30.9 26.0 - 34.0 pg   MCHC 32.2 30.0 - 36.0 g/dL   RDW 96.2 22.9 - 79.8 %   Platelets 168 150 - 400 K/uL   nRBC 0.0 0.0 - 0.2 %  CBC     Status: Abnormal   Collection Time: 05/26/20  3:36 AM  Result Value Ref Range   WBC 9.5 4.0 - 10.5 K/uL   RBC 2.67 (L) 3.87 - 5.11 MIL/uL   Hemoglobin 8.5 (L) 12.0 - 15.0 g/dL   HCT 92.1 (L) 36 - 46 %   MCV 97.4 80.0 - 100.0 fL   MCH 31.8 26.0 - 34.0 pg   MCHC 32.7 30.0 - 36.0 g/dL   RDW 19.4 17.4 - 08.1 %   Platelets 169 150 - 400 K/uL   nRBC 0.0 0.0 - 0.2 %  Comprehensive metabolic panel     Status: Abnormal   Collection Time: 05/26/20  3:36 AM  Result Value Ref Range   Sodium 141 135 - 145 mmol/L   Potassium 4.1 3.5 - 5.1 mmol/L   Chloride 112 (H) 98 - 111 mmol/L   CO2 26 22 - 32 mmol/L   Glucose, Bld 152 (H) 70 - 99 mg/dL   BUN 8 6 - 20 mg/dL   Creatinine, Ser 4.48 0.44 - 1.00 mg/dL   Calcium 8.1 (L) 8.9 - 10.3 mg/dL   Total Protein 5.1 (L) 6.5 - 8.1 g/dL   Albumin 2.6 (L) 3.5 - 5.0 g/dL   AST 82 (H) 15 - 41 U/L   ALT 42 0 - 44 U/L   Alkaline Phosphatase 59 38 - 126 U/L   Total Bilirubin 0.4 0.3 - 1.2 mg/dL   GFR, Estimated >18 >56 mL/min   Anion gap 3 (L) 5 - 15    Assessment & Plan: Present on Admission: . Pneumothorax on left . Closed left subtrochanteric femur fracture (HCC)    LOS: 1 day   Additional comments:I reviewed the patient's new clinical lab test results. Marland Kitchen PHBC Subtrochanteric left femur fx - S/P IMN by Dr. Jena Gauss 11/18, WBAT Comminuted left iliac crest fx & left pubic body fx - S/P CR by Dr. Jena Gauss 11/18 G1 liver injury, G2 splenic injury, possible left retroperitoneal hematoma, small mesenteric  contusion - Abd soft and NT, CBC this PM, no LMWH yet R occipital condyle fx - collar per Dr. Franky Macho Left 2nd - 9th rib fxs, R 2nd & 3rd rib fx's - Multimodal  pain control, Pulm toilet Left hemopneumothorax and subcu air - s/p pigtail placement by EDP. CXR with stable small PTX, a lot of SQ air. Continue -20 today, CXR in AM ABL anemia  L pulmonary contusions - pulm toilet Possible R diaphragmatic injury - doubt significant Left frontoparietal scalp hematoma Polysubstance abuse - notes marijuana and cocaine use this week. UDS + for cocaine. Prior IV drug use.   FEN - reg diet, KVO, add scheduled gabapentin VTE - SCDs, LMWH tomorrow if Hb stable Foley - in place with pelvic FXs, voiding trial 11/20 Dispo - to 4NP. PT/OT. She reports she will have a lot of help at D/C. Critical Care Total Time*: 34 Minutes  Violeta Gelinas, MD, MPH, FACS Trauma & General Surgery Use [email or web address] to contact on call provider  05/26/2020  *Care during the described time interval was provided by me. I have reviewed this patient's available data, including medical history, events of note, physical examination and test results as part of my evaluation.

## 2020-05-26 NOTE — Progress Notes (Signed)
Pt arrived to 4NP01 from 4NICU20. Pt drowsy, but easily awakens, orientedx4, placed on cardiac monitoring, vitals taken, and assessment completed. Chest tube hooked up to wall suction at -20, per Dr. Carollee Massed note.  Robina Ade, RN

## 2020-05-27 ENCOUNTER — Inpatient Hospital Stay (HOSPITAL_COMMUNITY): Payer: Self-pay

## 2020-05-27 LAB — CBC
HCT: 26.8 % — ABNORMAL LOW (ref 36.0–46.0)
Hemoglobin: 8.6 g/dL — ABNORMAL LOW (ref 12.0–15.0)
MCH: 31.3 pg (ref 26.0–34.0)
MCHC: 32.1 g/dL (ref 30.0–36.0)
MCV: 97.5 fL (ref 80.0–100.0)
Platelets: 165 10*3/uL (ref 150–400)
RBC: 2.75 MIL/uL — ABNORMAL LOW (ref 3.87–5.11)
RDW: 12.6 % (ref 11.5–15.5)
WBC: 11.6 10*3/uL — ABNORMAL HIGH (ref 4.0–10.5)
nRBC: 0 % (ref 0.0–0.2)

## 2020-05-27 MED ORDER — ACETAMINOPHEN 500 MG PO TABS
1000.0000 mg | ORAL_TABLET | Freq: Four times a day (QID) | ORAL | Status: DC
  Administered 2020-05-27 – 2020-05-30 (×14): 1000 mg via ORAL
  Filled 2020-05-27 (×14): qty 2

## 2020-05-27 MED ORDER — ENOXAPARIN SODIUM 30 MG/0.3ML ~~LOC~~ SOLN
30.0000 mg | Freq: Two times a day (BID) | SUBCUTANEOUS | Status: DC
  Administered 2020-05-27 – 2020-05-30 (×7): 30 mg via SUBCUTANEOUS
  Filled 2020-05-27 (×7): qty 0.3

## 2020-05-27 MED ORDER — GABAPENTIN 300 MG PO CAPS
300.0000 mg | ORAL_CAPSULE | Freq: Three times a day (TID) | ORAL | Status: DC
  Administered 2020-05-27 – 2020-05-30 (×10): 300 mg via ORAL
  Filled 2020-05-27 (×10): qty 1
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
## 2020-05-27 NOTE — Progress Notes (Signed)
Trauma/Critical Care Follow Up Note  Subjective:    Overnight Issues:   Objective:  Vital signs for last 24 hours: Temp:  [98 F (36.7 C)-99 F (37.2 C)] 98.7 F (37.1 C) (11/20 0732) Pulse Rate:  [99-119] 109 (11/20 0732) Resp:  [12-24] 13 (11/20 0732) BP: (98-116)/(50-73) 110/66 (11/20 0732) SpO2:  [91 %-98 %] 98 % (11/20 0732)  Hemodynamic parameters for last 24 hours:    Intake/Output from previous day: 11/19 0701 - 11/20 0700 In: 542.6 [P.O.:240; I.V.:202.6; IV Piggyback:100] Out: 2100 [Urine:2100]  Intake/Output this shift: No intake/output data recorded.  Vent settings for last 24 hours:    Physical Exam:  Gen: comfortable, no distress Neuro: non-focal exam HEENT: PERRL Neck: c-collar in place CV: RRR Pulm: unlabored breathing Abd: soft, NT GU: clear yellow urine Extr: wwp, no edema   Results for orders placed or performed during the hospital encounter of 05/24/20 (from the past 24 hour(s))  CBC     Status: Abnormal   Collection Time: 05/26/20  2:17 PM  Result Value Ref Range   WBC 8.4 4.0 - 10.5 K/uL   RBC 2.64 (L) 3.87 - 5.11 MIL/uL   Hemoglobin 8.3 (L) 12.0 - 15.0 g/dL   HCT 42.7 (L) 36 - 46 %   MCV 97.7 80.0 - 100.0 fL   MCH 31.4 26.0 - 34.0 pg   MCHC 32.2 30.0 - 36.0 g/dL   RDW 06.2 37.6 - 28.3 %   Platelets 158 150 - 400 K/uL   nRBC 0.0 0.0 - 0.2 %  CBC     Status: Abnormal   Collection Time: 05/27/20  3:38 AM  Result Value Ref Range   WBC 11.6 (H) 4.0 - 10.5 K/uL   RBC 2.75 (L) 3.87 - 5.11 MIL/uL   Hemoglobin 8.6 (L) 12.0 - 15.0 g/dL   HCT 15.1 (L) 36 - 46 %   MCV 97.5 80.0 - 100.0 fL   MCH 31.3 26.0 - 34.0 pg   MCHC 32.1 30.0 - 36.0 g/dL   RDW 76.1 60.7 - 37.1 %   Platelets 165 150 - 400 K/uL   nRBC 0.0 0.0 - 0.2 %    Assessment & Plan:  Present on Admission: . Pneumothorax on left . Closed left subtrochanteric femur fracture (HCC)    LOS: 2 days   Additional comments:I reviewed the patient's new clinical lab test  results.   and I reviewed the patients new imaging test results.    PHBC  Subtrochanteric left femur fx - S/P IMN by Dr. Jena Gauss 11/18, WBAT Comminuted left iliac crest fx & left pubic body fx - S/P CR by Dr. Jena Gauss 11/18 G1 liver injury, G2 splenic injury, possible left retroperitoneal hematoma, small mesenteric contusion - Abd soft and NT R occipital condyle fx - collar per Dr. Franky Macho Left 2nd - 9th rib fxs, R 2nd & 3rd rib fx's - Multimodal pain control, Pulm toilet Left hemopneumothorax and subcu air - s/p pigtail placement by EDP. CXR reviewed, WS today, CXR in AM ABL anemia  L pulmonary contusions - pulm toilet Possible R diaphragmatic injury - doubt this is clinically significant Left frontoparietal scalp hematoma Polysubstance abuse - notes marijuana and cocaine use this week. UDS + for cocaine. Prior IV drug use. TOC c/s FEN - reg diet VTE - SCDs, LMWH  Foley - d/c today Dispo - 4NP. PT/OT cleared for home. She reports she will have a lot of help at D/C. Plan for d/c in AM  [facility]  Floreen Comber, MD Trauma & General Surgery Please use [email or web address] to contact on call provider  05/27/2020  *Care during the described time interval was provided by me. I have reviewed this patient's available data, including medical history, events of note, physical examination and test results as part of my evaluation.
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
## 2020-05-28 ENCOUNTER — Inpatient Hospital Stay (HOSPITAL_COMMUNITY): Payer: Self-pay

## 2020-05-28 LAB — CBC
HCT: 26.2 % — ABNORMAL LOW (ref 36.0–46.0)
Hemoglobin: 8.3 g/dL — ABNORMAL LOW (ref 12.0–15.0)
MCH: 31 pg (ref 26.0–34.0)
MCHC: 31.7 g/dL (ref 30.0–36.0)
MCV: 97.8 fL (ref 80.0–100.0)
Platelets: 175 10*3/uL (ref 150–400)
RBC: 2.68 MIL/uL — ABNORMAL LOW (ref 3.87–5.11)
RDW: 12.4 % (ref 11.5–15.5)
WBC: 8.5 10*3/uL (ref 4.0–10.5)
nRBC: 0 % (ref 0.0–0.2)
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
## 2020-05-28 NOTE — Progress Notes (Signed)
Orthopaedic Trauma Service (OTS)  3 Days Post-Op Procedure(s) (LRB): INTRAMEDULLARY (IM) NAIL FEMORAL (Left)  Subjective: Patient reports pain as severe and burning in nature.    Objective: Current Vitals Blood pressure 106/65, pulse (!) 101, temperature 98.2 F (36.8 C), temperature source Oral, resp. rate 18, height 5' (1.524 m), weight 59.9 kg, SpO2 100 %. Vital signs in last 24 hours: Temp:  [98.2 F (36.8 C)-98.7 F (37.1 C)] 98.2 F (36.8 C) (11/21 0835) Pulse Rate:  [88-109] 101 (11/21 0835) Resp:  [11-19] 18 (11/21 0835) BP: (92-117)/(64-77) 106/65 (11/21 0835) SpO2:  [99 %-100 %] 100 % (11/21 0835)  Intake/Output from previous day: 11/20 0701 - 11/21 0700 In: -  Out: 1 [Urine:1]  LABS Recent Labs    05/25/20 1936 05/26/20 0336 05/26/20 1417 05/27/20 0338 05/28/20 0336  HGB 9.2* 8.5* 8.3* 8.6* 8.3*   Recent Labs    05/27/20 0338 05/28/20 0336  WBC 11.6* 8.5  RBC 2.75* 2.68*  HCT 26.8* 26.2*  PLT 165 175   Recent Labs    05/26/20 0336  NA 141  K 4.1  CL 112*  CO2 26  BUN 8  CREATININE 0.62  GLUCOSE 152*  CALCIUM 8.1*   No results for input(s): LABPT, INR in the last 72 hours.   Physical Exam LLE  Dressing intact, clean, dry; ACE and webroll removed; can apply TED if tolerates  Edema/ swelling controlled  Sens: DPN, SPN, TN intact  Motor: EHL, FHL, and lessor toe ext and flex all intact grossly  Brisk cap refill, warm to touch  Assessment/Plan: 3 Days Post-Op Procedure(s) (LRB): INTRAMEDULLARY (IM) NAIL FEMORAL (Left) 1. PT/OT  2. DVT proph Lovenox 3. Neurontin dose increased; off dilaudid now 4.  F/u 8-14 days  Myrene Galas, MD [facility], [facility] [phone]

## 2020-05-28 NOTE — Progress Notes (Signed)
   Trauma/Critical Care Follow Up Note  Subjective:    Overnight Issues: Chest tube placed back to suction yesterday afternoon due to increase size of pneumothorax on CXR. This morning, apical pneumothorax appears small. Patient with no respiratory distress. Anxious to go home.  Objective:  Vital signs for last 24 hours: Temp:  [98.2 F (36.8 C)-98.7 F (37.1 C)] 98.2 F (36.8 C) (11/21 0835) Pulse Rate:  [88-109] 101 (11/21 0835) Resp:  [11-19] 18 (11/21 0835) BP: (92-117)/(64-77) 106/65 (11/21 0835) SpO2:  [99 %-100 %] 100 % (11/21 0835)  Hemodynamic parameters for last 24 hours:    Intake/Output from previous day: 11/20 0701 - 11/21 0700 In: -  Out: 1 [Urine:1]  Intake/Output this shift: Total I/O In: 360 [P.O.:360] Out: -   Vent settings for last 24 hours:    Physical Exam:  Gen: comfortable, no distress Neuro: non-focal exam HEENT: PERRL Neck: c-collar in place CV: RRR Pulm: unlabored breathing, L CT in place to suction at -40, no air leak. Left chest wall crepitus. Abd: soft, nondistended, nontender to palpation Extr: wwp, no edema   Results for orders placed or performed during the hospital encounter of 05/24/20 (from the past 24 hour(s))  CBC     Status: Abnormal   Collection Time: 05/28/20  3:36 AM  Result Value Ref Range   WBC 8.5 4.0 - 10.5 K/uL   RBC 2.68 (L) 3.87 - 5.11 MIL/uL   Hemoglobin 8.3 (L) 12.0 - 15.0 g/dL   HCT 13.0 (L) 36 - 46 %   MCV 97.8 80.0 - 100.0 fL   MCH 31.0 26.0 - 34.0 pg   MCHC 31.7 30.0 - 36.0 g/dL   RDW 86.5 78.4 - 69.6 %   Platelets 175 150 - 400 K/uL   nRBC 0.0 0.0 - 0.2 %    Assessment & Plan:  Present on Admission: . Pneumothorax on left . Closed left subtrochanteric femur fracture (HCC)    LOS: 3 days   Additional comments:I reviewed the patient's new clinical lab test results.   and I reviewed the patients new imaging test results.    PHBC  Subtrochanteric left femur fx - S/P IMN by Dr. Jena Gauss 11/18,  WBAT Comminuted left iliac crest fx & left pubic body fx - S/P CR by Dr. Jena Gauss 11/18 G1 liver injury, G2 splenic injury, possible left retroperitoneal hematoma, small mesenteric contusion - Abd soft and NT R occipital condyle fx - collar per Dr. Franky Macho Left 2nd - 9th rib fxs, R 2nd & 3rd rib fx's - Multimodal pain control, Pulm toilet Left hemopneumothorax and subcu air - s/p pigtail placement by EDP. Pneumothorax improved this morning - place back to water seal this morning, repeat CXR this afternoon. ABL anemia - hemoglobin stable at 8.3 today L pulmonary contusions - pulm toilet Possible R diaphragmatic injury - no symptoms currently, continue to monitor Left frontoparietal scalp hematoma Polysubstance abuse - notes marijuana and cocaine use this week. UDS + for cocaine. Prior IV drug use. TOC c/s FEN - reg diet VTE - SCDs, LMWH  Dispo - 4NP. PT/OT cleared for home. Remain inpatient due to chest tube.  Sophronia Simas, MD [facility] Surgery General, Hepatobiliary and Pancreatic Surgery 05/28/20 10:31 AM

## 2020-05-29 ENCOUNTER — Inpatient Hospital Stay (HOSPITAL_COMMUNITY): Payer: Self-pay

## 2020-05-29 DIAGNOSIS — S02113A Unspecified occipital condyle fracture, initial encounter for closed fracture: Secondary | ICD-10-CM

## 2020-05-29 DIAGNOSIS — S2249XA Multiple fractures of ribs, unspecified side, initial encounter for closed fracture: Secondary | ICD-10-CM

## 2020-05-29 DIAGNOSIS — S32302A Unspecified fracture of left ilium, initial encounter for closed fracture: Secondary | ICD-10-CM

## 2020-05-29 DIAGNOSIS — S36039A Unspecified laceration of spleen, initial encounter: Secondary | ICD-10-CM

## 2020-05-29 DIAGNOSIS — S36113A Laceration of liver, unspecified degree, initial encounter: Secondary | ICD-10-CM

## 2020-05-29 LAB — CBC
HCT: 27.4 % — ABNORMAL LOW (ref 36.0–46.0)
Hemoglobin: 8.7 g/dL — ABNORMAL LOW (ref 12.0–15.0)
MCH: 31.2 pg (ref 26.0–34.0)
MCHC: 31.8 g/dL (ref 30.0–36.0)
MCV: 98.2 fL (ref 80.0–100.0)
Platelets: 205 10*3/uL (ref 150–400)
RBC: 2.79 MIL/uL — ABNORMAL LOW (ref 3.87–5.11)
RDW: 12.1 % (ref 11.5–15.5)
WBC: 7.4 10*3/uL (ref 4.0–10.5)
nRBC: 0.3 % — ABNORMAL HIGH (ref 0.0–0.2)

## 2020-05-29 MED ORDER — LORAZEPAM 2 MG/ML IJ SOLN
1.0000 mg | Freq: Once | INTRAMUSCULAR | Status: DC
  Filled 2020-05-29: qty 1

## 2020-05-29 MED ORDER — LORAZEPAM 2 MG/ML IJ SOLN: 1.0000 mg | Freq: Once | INTRAMUSCULAR | Status: AC

## 2020-05-29 MED ORDER — LORAZEPAM 1 MG PO TABS
1.0000 mg | ORAL_TABLET | Freq: Once | ORAL | Status: AC
  Administered 2020-05-29: 1 mg via ORAL
  Filled 2020-05-29: qty 1
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
## 2020-05-29 NOTE — Progress Notes (Signed)
CXR reviewed after CT pulled earlier this AM. There is a minimal increase in size of small left apical PTX. Will write for AM CXR and monitor. Discussed with patient and patients RN.   Leary Roca, PA-C
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
## 2020-05-29 NOTE — TOC Transition Note (Signed)
Transition of Care (TOC) - CM/SW Discharge Note Donn Pierini RN,BSN Transitions of Care Unit 4NP (non trauma) - RN Case Manager See Treatment Team for direct Phone # Cross Coverage for Trauma  Patient Details  Name: Linda Jackson MRN: [id number] Date of Birth: 1991-01-16  Transition of Care [facility]) CM/SW Contact:  Darrold Span, RN Phone Number: 05/29/2020, 2:13 PM   Clinical Narrative:    Noted orders for [facility] and DME needs. Pt is uninsured and will be referred to [facility] and DME- calls made to [employer] for DME- 3n1 need- they will process and deliver to room if pt meets criteria for charity DME,  Call made to [place] with [facility] for HHPT/OT referral- they will reach out to pt to see if she will qualify for charity program and [facility] services.  Pt will be assisted with medications through [facility]- have entered into [facility] program. Will provide pt with letter for meds if TOC does not fill meds here.    Final next level of care: Home w Home Health Services Barriers to Discharge: No Barriers Identified   Patient Goals and CMS Choice Patient states their goals for this hospitalization and ongoing recovery are:: return home CMS Medicare.gov Compare Post Acute Care list provided to::  [facility] referral) Choice offered to / list presented to : NA  Discharge Placement             Home with [facility]          Discharge Plan and Services   Discharge Planning Services: CM Consult, Medication Assistance, MATCH Program Post Acute Care Choice: Durable Medical Equipment, Home Health          DME Arranged: 3-N-1 DME Agency: [facility] Date DME Agency Contacted: 05/29/20 Time DME Agency Contacted: [phone] Representative spoke with at DME Agency: Silvio Pate HH Arranged: PT, OT HH Agency: [facility] Date HH Agency Contacted: 05/29/20 Time HH Agency Contacted: 1413 Representative spoke with at [facility] Agency: K[place]  Social Determinants of Health (SDOH) Interventions      Readmission Risk Interventions Readmission Risk Prevention Plan 05/29/2020  Medication Screening Complete  Transportation Screening Complete  Some recent data might be hidden

## 2020-05-30 ENCOUNTER — Inpatient Hospital Stay (HOSPITAL_COMMUNITY): Payer: Self-pay

## 2020-05-30 ENCOUNTER — Other Ambulatory Visit (HOSPITAL_COMMUNITY): Payer: Self-pay | Admitting: Physician Assistant

## 2020-05-30 LAB — CBC
HCT: 29.7 % — ABNORMAL LOW (ref 36.0–46.0)
Hemoglobin: 9.5 g/dL — ABNORMAL LOW (ref 12.0–15.0)
MCH: 31 pg (ref 26.0–34.0)
MCHC: 32 g/dL (ref 30.0–36.0)
MCV: 97.1 fL (ref 80.0–100.0)
Platelets: 241 10*3/uL (ref 150–400)
RBC: 3.06 MIL/uL — ABNORMAL LOW (ref 3.87–5.11)
RDW: 12.3 % (ref 11.5–15.5)
WBC: 9.1 10*3/uL (ref 4.0–10.5)
nRBC: 0.2 % (ref 0.0–0.2)

## 2020-05-30 LAB — GLUCOSE, CAPILLARY: Glucose-Capillary: 90 mg/dL (ref 70–99)

## 2020-05-30 MED ORDER — ENOXAPARIN SODIUM 30 MG/0.3ML ~~LOC~~ SOLN: 40.0000 mg | SUBCUTANEOUS | 0 refills | Status: DC

## 2020-05-30 MED ORDER — METHOCARBAMOL 500 MG PO TABS
1000.0000 mg | ORAL_TABLET | Freq: Three times a day (TID) | ORAL | 0 refills | Status: DC | PRN
Start: 2020-05-30 — End: 2020-05-30

## 2020-05-30 MED ORDER — GABAPENTIN 300 MG PO CAPS
300.0000 mg | ORAL_CAPSULE | Freq: Three times a day (TID) | ORAL | 0 refills | Status: DC | PRN
Start: 2020-05-30 — End: 2020-05-30

## 2020-05-30 MED ORDER — VITAMIN D3 25 MCG PO TABS: 2000.0000 [IU] | ORAL_TABLET | Freq: Two times a day (BID) | ORAL | 0 refills | Status: DC

## 2020-05-30 MED ORDER — ENOXAPARIN SODIUM 40 MG/0.4ML ~~LOC~~ SOLN: 40.0000 mg | SUBCUTANEOUS | 0 refills | Status: DC

## 2020-05-30 MED ORDER — ACETAMINOPHEN 500 MG PO TABS
1000.0000 mg | ORAL_TABLET | Freq: Three times a day (TID) | ORAL | 0 refills | Status: DC | PRN
Start: 2020-05-30 — End: 2022-07-04

## 2020-05-30 MED ORDER — OXYCODONE HCL 10 MG PO TABS: 10.0000 mg | ORAL_TABLET | Freq: Four times a day (QID) | ORAL | 0 refills | Status: DC | PRN

## 2020-05-30 MED ORDER — POLYETHYLENE GLYCOL 3350 17 G PO PACK: 17.0000 g | PACK | Freq: Every day | ORAL | 0 refills | Status: DC | PRN

## 2020-05-30 MED FILL — GABAPENTIN 300 MG CAPSULE: 300 | 20 days supply | Qty: 60 | Fill #0

## 2020-05-30 MED FILL — ENOXAPARIN SODIUM 40 MG/0.4: 40 | 30 days supply | Qty: 12 | Fill #0

## 2020-05-30 MED FILL — VITAMIN D3 25 MCG TABS: 25 | 30 days supply | Qty: 120 | Fill #0

## 2020-05-30 MED FILL — oxyCODONE HCL 10 MG TABS: 10 | 5 days supply | Qty: 20 | Fill #0

## 2020-05-30 MED FILL — METHOCARBAMOL 500 MG TABS: 500 | 7 days supply | Qty: 45 | Fill #0

## 2020-05-30 NOTE — Progress Notes (Addendum)
Pt discharged. Room and personal belongings packed, along with rolling walker, 3n1, and TOC medications. No PIV to remove. Pt educated on and demonstrated Lovenox use prior to discharge. Pt also educated on discharge information, with no questions asked. Pt escorted to private vehicle in wheelchair by RN and NT along with belongings.   Robina Ade, RN

## 2020-05-30 NOTE — Progress Notes (Signed)
Pt called this RN to the room and was crying/distressed. She said she had just spoken to her cousin and that her cousin would not be coming to pick her up anymore and that she needed to go to rehab. Pt said that she had someone else who could pick her up in an hour and that she could go there. Butler Denmark, case manager, and she spoke to pt. Pt will now go home with alternate ride and have a referral for outpatient therapy if she chooses to do so.   Robina Ade, RN

## 2020-05-30 NOTE — TOC CAGE-AID Note (Signed)
Transition of Care [facility]) - CAGE-AID Screening   Patient Details  Name: Linda Jackson MRN: [id number] Date of Birth: Feb 21, 1991  Transition of Care [facility]) CM/SW Contact:    Emeterio Reeve, [place] Phone Number: 05/30/2020, 3:51 PM   Clinical Narrative:  CSW met with pt at bedside. CSW introduced self and explained her role at the hospital.  Pt reports occasional alcohol use and occasional cocaine use. Pt reports she does not need resources at this time.   CAGE-AID Screening:    Have You Ever Felt You Ought to Cut Down on Your Drinking or Drug Use?: No Have People Annoyed You By Critizing Your Drinking Or Drug Use?: No Have You Felt Bad Or Guilty About Your Drinking Or Drug Use?: No Have You Ever Had a Drink or Used Drugs First Thing In The Morning to Steady Your Nerves or to Get Rid of a Hangover?: No CAGE-AID Score: 0  Substance Abuse Education Offered: Yes  Substance abuse interventions: Patient Counseling  Emeterio Reeve, Latanya Presser, [place] Social Worker [phone]
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
## 2020-05-30 NOTE — TOC Transition Note (Signed)
Transition of Care [facility]) - CM/SW Discharge Note   Patient Details  Name: Linda BARRETTO MRN: [id number] Date of Birth: 04/15/1991  [facility] CM/SW Contact:  Glennon Mac, RN Phone Number: 05/30/2020, 3:18 PM   Clinical Narrative:   Pt medically stable for discharge home today.  She states she had planned to dc home with her cousin, but her cousin has called and states she cannot come to her home.  She has made other arrangements to dc to a friend's home in [place].  She states she prefers to do OP rehab instead of home health.  Will make referral to [facility] in [place] for follow up.  3 in 1 has been delivered to bedside from [facility]; DC meds have been delivered from [facility] pharmacy.      Final next level of care: OP Rehab Barriers to Discharge: Barriers Resolved   Patient Goals and CMS Choice Patient states their goals for this hospitalization and ongoing recovery are:: return home CMS Medicare.gov Compare Post Acute Care list provided to::  [facility] referral) Choice offered to / list presented to : NA                        Discharge Plan and Services   Discharge Planning Services: CM Consult, Medication Assistance, MATCH Program Post Acute Care Choice: Durable Medical Equipment, Home Health          DME Arranged: 3-N-1 DME Agency: [facility] Date DME Agency Contacted: 05/29/20 Time DME Agency Contacted: [phone] Representative spoke with at DME Agency: Silvio Pate  Social Determinants of Health (SDOH) Interventions     Readmission Risk Interventions Readmission Risk Prevention Plan 05/30/2020 05/29/2020  Post Dischage Appt Complete -  Medication Screening Complete Complete  Transportation Screening Complete Complete  Some recent data might be hidden   Quintella Baton, RN, BSN  Trauma/Neuro ICU Case Manager [phone]

## 2022-02-16 IMAGING — DX DG CHEST 1V PORT
1 series · 1 of 1 positions shown · non-contrast
Comparison: 05/27/2020, 05/26/2020, 05/25/2020

CLINICAL DATA: Respiratory failure

EXAM:
PORTABLE CHEST 1 VIEW

[chest]
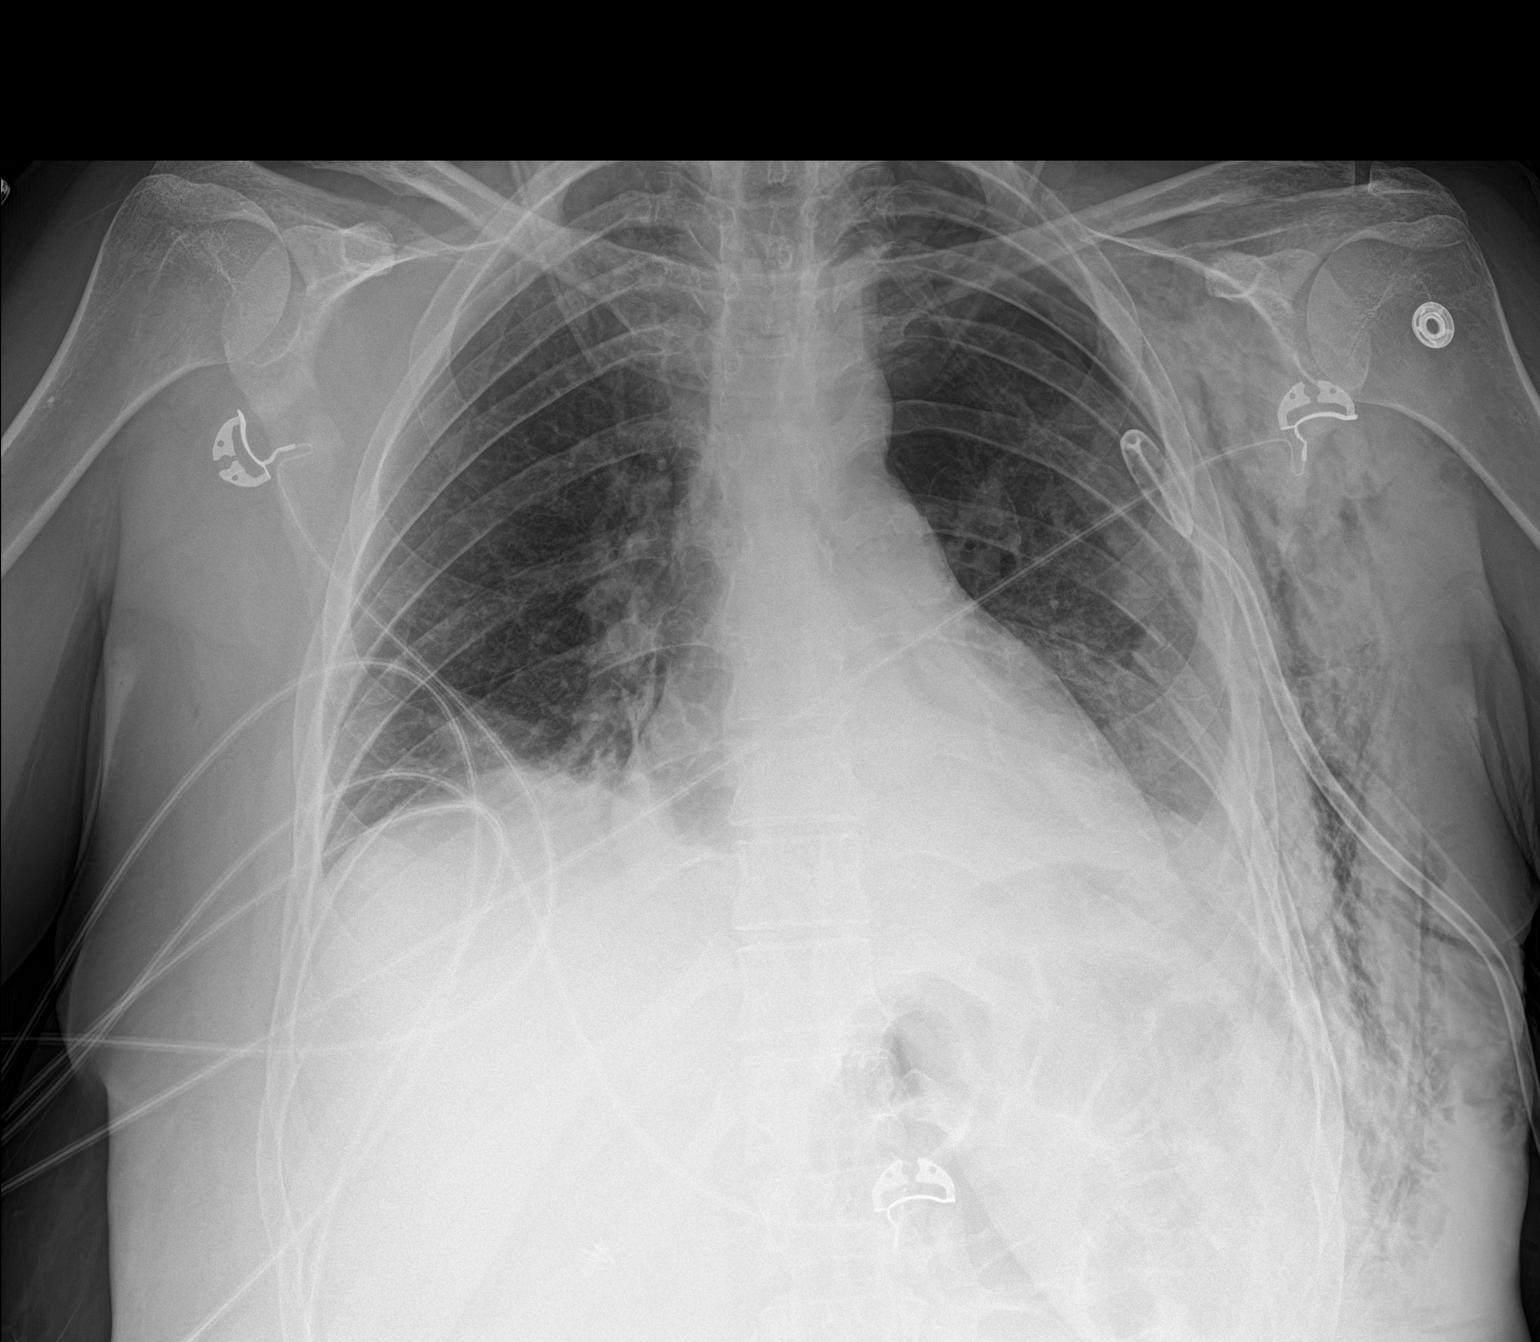

[1 of 1 positions shown; findings below may reference images not displayed]

FINDINGS: Left-sided chest tube remains in place. Continued left chest wall
emphysema. Similar airspace disease at both bases. Small moderate
left apicolateral pneumothorax may be slightly increased in size.
There are multiple displaced left-sided rib fractures. Stable
cardiomediastinal silhouette.
IMPRESSION: 1. Left-sided chest tube remains in place. Small to moderate left
apicolateral pneumothorax may be slightly increased in size.
2. Continued bibasilar airspace disease.

## 2022-07-04 ENCOUNTER — Ambulatory Visit (INDEPENDENT_AMBULATORY_CARE_PROVIDER_SITE_OTHER): Payer: 59 | Admitting: Adult Health

## 2022-07-04 ENCOUNTER — Encounter: Payer: Self-pay | Admitting: Adult Health

## 2022-07-04 VITALS — BP 121/83 | HR 91 | Ht 61.0 in | Wt 206.0 lb

## 2022-07-04 DIAGNOSIS — O3680X0 Pregnancy with inconclusive fetal viability, not applicable or unspecified: Secondary | ICD-10-CM | POA: Diagnosis not present

## 2022-07-04 DIAGNOSIS — F419 Anxiety disorder, unspecified: Secondary | ICD-10-CM

## 2022-07-04 DIAGNOSIS — Z3A01 Less than 8 weeks gestation of pregnancy: Secondary | ICD-10-CM

## 2022-07-04 DIAGNOSIS — N926 Irregular menstruation, unspecified: Secondary | ICD-10-CM | POA: Diagnosis not present

## 2022-07-04 DIAGNOSIS — F32A Depression, unspecified: Secondary | ICD-10-CM

## 2022-07-04 DIAGNOSIS — R69 Illness, unspecified: Secondary | ICD-10-CM | POA: Diagnosis not present

## 2022-07-04 DIAGNOSIS — F17200 Nicotine dependence, unspecified, uncomplicated: Secondary | ICD-10-CM | POA: Diagnosis not present

## 2022-07-04 LAB — POCT URINE PREGNANCY: Preg Test, Ur: POSITIVE — AB

## 2022-07-04 NOTE — Progress Notes (Signed)
  Subjective:     Patient ID: Linda Jackson, female   DOB: 1991-02-18, 31 y.o.   MRN: [id number]  HPI Linda Jackson is a 31 year old white female, with SO, [id number], in for UPT, has missed a period. Has rod in leg.  Review of Systems +missed period     Objective:   Physical Exam BP 121/83 (BP Location: Left Arm, Patient Position: Sitting, Cuff Size: Normal)   Pulse 91   Ht 5\' 1"  (1.549 m)   Wt 206 lb (93.4 kg)   LMP 05/22/2022   BMI 38.92 kg/m    UPT is +, about 6+1 weeks by LMP, with EDD 02/25/22. Skin warm and dry. Neck: mid line trachea, normal thyroid, good ROM, no lymphadenopathy noted. Lungs: clear to ausculation bilaterally. Cardiovascular: regular rate and rhythm.  AA is low Fall risk is low  Upstream - 07/04/22 0921       Pregnancy Intention Screening   Does the patient want to become pregnant in the next year? No    Does the patient's partner want to become pregnant in the next year? No    Would the patient like to discuss contraceptive options today? No      Contraception Wrap Up   Current Method Pregnant/Seeking Pregnancy    End Method Pregnant/Seeking Pregnancy    Contraception Counseling Provided No                07/04/2022    9:20 AM  GAD 7 : Generalized Anxiety Score  Nervous, Anxious, on Edge 2  Control/stop worrying 2  Worry too much - different things 2  Trouble relaxing 1  Restless 0  Easily annoyed or irritable 2  Afraid - awful might happen 0  Total GAD 7 Score 9      Upstream - 07/04/22 07/06/22       Pregnancy Intention Screening   Does the patient want to become pregnant in the next year? No    Does the patient's partner want to become pregnant in the next year? No    Would the patient like to discuss contraceptive options today? No      Contraception Wrap Up   Current Method Pregnant/Seeking Pregnancy    End Method Pregnant/Seeking Pregnancy    Contraception Counseling Provided No             Assessment:     1. Missed  period UPT +   2. Less than [redacted] weeks gestation of pregnancy Take PNV with folic acid  3. Encounter to determine fetal viability of pregnancy, single or unspecified fetus Return in about 3 weeks for dating 3007  4. Smoker Try to decrease smoking  5. Anxiety and depression Has vistaril at home and usually takes trazodone but has not since finding out pregnant, but resume    Plan:     Review New OB packet by [facility]

## 2022-07-08 NOTE — L&D Delivery Note (Signed)
OB/GYN Faculty Practice Delivery Note  Linda Jackson is a 32 y.o. [phone] s/p vag del at [redacted]w[redacted]d. She was admitted for IOL due to gHTN dx in the office on 02/19/23.   ROM: 4h 62m with clear fluid GBS Status: neg Maximum Maternal Temperature: 98.2  Labor Progress: Linda Jackson was admitted for IOL due to new mild range BP elevations; she was asymptomatic and had neg pre-e labs; she received cx ripening with a foley and cytotec followed by Pitocin and AROM prior to progressing to vag delivery.  Delivery Date/Time: August 15th, 2024 at 0422 Delivery: Called to room and patient was complete and pushing. Head delivered LOA. No nuchal cord present x 1; somersaulted through and reduced after delivery. Shoulder and body delivered in usual fashion. Infant with spontaneous cry, placed on mother's abdomen, dried and stimulated. Cord clamped x 2 after 1-minute delay, and cut by FOB. Cord blood drawn. Placenta delivered spontaneously with gentle cord traction. Fundus firm with massage and Pitocin. Labia, perineum, vagina, and cervix inspected and found to be intact.   Placenta: spont, intact; to L&D Complications: none Lacerations: none EBL: 283cc Analgesia: epidural  Postpartum Planning [x]  message to sent to schedule follow-up including 1wk BP check [x]  Lasix 20mg  x 5d, meds to beds, and PP BabyRx ordered  Infant: boy  APGARs 8/9  3650g (7lb 13.6oz)  Linda Jackson, CNM  02/20/2023 5:25 AM

## 2022-07-26 ENCOUNTER — Ambulatory Visit (INDEPENDENT_AMBULATORY_CARE_PROVIDER_SITE_OTHER): Payer: 59

## 2022-07-26 ENCOUNTER — Encounter: Payer: 59 | Admitting: *Deleted

## 2022-07-26 DIAGNOSIS — Z3A01 Less than 8 weeks gestation of pregnancy: Secondary | ICD-10-CM | POA: Diagnosis not present

## 2022-07-26 DIAGNOSIS — Z3481 Encounter for supervision of other normal pregnancy, first trimester: Secondary | ICD-10-CM | POA: Diagnosis not present

## 2022-07-26 DIAGNOSIS — Z3A09 9 weeks gestation of pregnancy: Secondary | ICD-10-CM | POA: Diagnosis not present

## 2022-07-26 NOTE — Progress Notes (Signed)
[place] 9+2 wks,single IUP with yolk sac,normal ovaries,FHR 159 bpm,CRL 26.69 mm

## 2022-08-19 ENCOUNTER — Encounter: Payer: Self-pay | Admitting: Women's Health

## 2022-08-19 ENCOUNTER — Encounter: Payer: 59 | Admitting: *Deleted

## 2022-08-19 ENCOUNTER — Ambulatory Visit (INDEPENDENT_AMBULATORY_CARE_PROVIDER_SITE_OTHER): Payer: 59 | Admitting: Women's Health

## 2022-08-19 VITALS — BP 106/74 | HR 74 | Wt 208.0 lb

## 2022-08-19 DIAGNOSIS — O09291 Supervision of pregnancy with other poor reproductive or obstetric history, first trimester: Secondary | ICD-10-CM

## 2022-08-19 DIAGNOSIS — F418 Other specified anxiety disorders: Secondary | ICD-10-CM | POA: Diagnosis not present

## 2022-08-19 DIAGNOSIS — Z3480 Encounter for supervision of other normal pregnancy, unspecified trimester: Secondary | ICD-10-CM

## 2022-08-19 DIAGNOSIS — O09299 Supervision of pregnancy with other poor reproductive or obstetric history, unspecified trimester: Secondary | ICD-10-CM

## 2022-08-19 DIAGNOSIS — Z3A12 12 weeks gestation of pregnancy: Secondary | ICD-10-CM | POA: Diagnosis not present

## 2022-08-19 DIAGNOSIS — Z3481 Encounter for supervision of other normal pregnancy, first trimester: Secondary | ICD-10-CM

## 2022-08-19 DIAGNOSIS — Z3490 Encounter for supervision of normal pregnancy, unspecified, unspecified trimester: Secondary | ICD-10-CM | POA: Insufficient documentation

## 2022-08-19 DIAGNOSIS — F17200 Nicotine dependence, unspecified, uncomplicated: Secondary | ICD-10-CM

## 2022-08-19 DIAGNOSIS — F1991 Other psychoactive substance use, unspecified, in remission: Secondary | ICD-10-CM | POA: Insufficient documentation

## 2022-08-19 MED ORDER — ASPIRIN 81 MG PO TBEC: 81.0000 mg | DELAYED_RELEASE_TABLET | Freq: Every day | ORAL | 3 refills | Status: DC
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
## 2022-08-19 NOTE — Patient Instructions (Signed)
Linda Jackson, thank you for choosing our office today! We appreciate the opportunity to meet your healthcare needs. You may receive a short survey by mail, e-mail, or through [employer]. If you are happy with your care we would appreciate if you could take just a few minutes to complete the survey questions. We read all of your comments and take your feedback very seriously. Thank you again for choosing our office.  [facility] [employer] [facility] Team at [place] at [facility] ([place], [place] [address]) Entrance C, located off of [address] parking   Nausea & Vomiting Have saltine crackers or pretzels by your bed and eat a few bites before you raise your head out of bed in the morning Eat small frequent meals throughout the day instead of large meals Drink plenty of fluids throughout the day to stay hydrated, just don't drink a lot of fluids with your meals.  This can make your stomach fill up faster making you feel sick Do not brush your teeth right after you eat Products with real ginger are good for nausea, like ginger ale and ginger hard candy Make sure it says made with real ginger! Sucking on sour candy like lemon heads is also good for nausea If your prenatal vitamins make you nauseated, take them at night so you will sleep through the nausea Sea Bands If you feel like you need medicine for the nausea & vomiting please let us know If you are unable to keep any fluids or food down please let us know   Constipation Drink plenty of fluid, preferably water, throughout the day Eat foods high in fiber such as fruits, vegetables, and grains Exercise, such as walking, is a good way to keep your bowels regular Drink warm fluids, especially warm prune juice, or decaf coffee Eat a 1/2 cup of real oatmeal (not instant), 1/2 cup applesauce, and 1/2-1 cup warm prune juice every day If needed, you may take Colace (docusate sodium) stool softener  once or twice a day to help keep the stool soft.  If you still are having problems with constipation, you may take Miralax once daily as needed to help keep your bowels regular.   Home Blood Pressure Monitoring for Patients   Your provider has recommended that you check your blood pressure (BP) at least once a week at home. If you do not have a blood pressure cuff at home, one will be provided for you. Contact your provider if you have not received your monitor within 1 week.   Helpful Tips for Accurate Home Blood Pressure Checks  Don't smoke, exercise, or drink caffeine 30 minutes before checking your BP Use the restroom before checking your BP (a full bladder can raise your pressure) Relax in a comfortable upright chair Feet on the ground Left arm resting comfortably on a flat surface at the level of your heart Legs uncrossed Back supported Sit quietly and don't talk Place the cuff on your bare arm Adjust snuggly, so that only two fingertips can fit between your skin and the top of the cuff Check 2 readings separated by at least one minute Keep a log of your BP readings For a visual, please reference this diagram: http://ccnc.care/bpdiagram  Provider Name: [facility]     Phone: [phone]  Zone 1: ALL CLEAR  Continue to monitor your symptoms:  BP reading is less than 140 (top number) or less than 90 (bottom  number)  No right upper stomach pain No headaches or seeing spots No feeling nauseated or throwing up No swelling in face and hands  Zone 2: CAUTION Call your doctor's office for any of the following:  BP reading is greater than 140 (top number) or greater than 90 (bottom number)  Stomach pain under your ribs in the middle or right side Headaches or seeing spots Feeling nauseated or throwing up Swelling in face and hands  Zone 3: EMERGENCY  Seek immediate medical care if you have any of the following:  BP reading is greater than160 (top number) or greater than  110 (bottom number) Severe headaches not improving with Tylenol Serious difficulty catching your breath Any worsening symptoms from Zone 2    First Trimester of Pregnancy The first trimester of pregnancy is from week 1 until the end of week 12 (months 1 through 3). A week after a sperm fertilizes an egg, the egg will implant on the wall of the uterus. This embryo will begin to develop into a baby. Genes from you and your partner are forming the baby. The female genes determine whether the baby is a boy or a girl. At 6-8 weeks, the eyes and face are formed, and the heartbeat can be seen on ultrasound. At the end of 12 weeks, all the baby's organs are formed.  Now that you are pregnant, you will want to do everything you can to have a healthy baby. Two of the most important things are to get good prenatal care and to follow your health care provider's instructions. Prenatal care is all the medical care you receive before the baby's birth. This care will help prevent, find, and treat any problems during the pregnancy and childbirth. BODY CHANGES Your body goes through many changes during pregnancy. The changes vary from woman to woman.  You may gain or lose a couple of pounds at first. You may feel sick to your stomach (nauseous) and throw up (vomit). If the vomiting is uncontrollable, call your health care provider. You may tire easily. You may develop headaches that can be relieved by medicines approved by your health care provider. You may urinate more often. Painful urination may mean you have a bladder infection. You may develop heartburn as a result of your pregnancy. You may develop constipation because certain hormones are causing the muscles that push waste through your intestines to slow down. You may develop hemorrhoids or swollen, bulging veins (varicose veins). Your breasts may begin to grow larger and become tender. Your nipples may stick out more, and the tissue that surrounds them  (areola) may become darker. Your gums may bleed and may be sensitive to brushing and flossing. Dark spots or blotches (chloasma, mask of pregnancy) may develop on your face. This will likely fade after the baby is born. Your menstrual periods will stop. You may have a loss of appetite. You may develop cravings for certain kinds of food. You may have changes in your emotions from day to day, such as being excited to be pregnant or being concerned that something may go wrong with the pregnancy and baby. You may have more vivid and strange dreams. You may have changes in your hair. These can include thickening of your hair, rapid growth, and changes in texture. Some women also have hair loss during or after pregnancy, or hair that feels dry or thin. Your hair will most likely return to normal after your baby is born. WHAT TO EXPECT AT YOUR PRENATAL  VISITS During a routine prenatal visit: You will be weighed to make sure you and the baby are growing normally. Your blood pressure will be taken. Your abdomen will be measured to track your baby's growth. The fetal heartbeat will be listened to starting around week 10 or 12 of your pregnancy. Test results from any previous visits will be discussed. Your health care provider may ask you: How you are feeling. If you are feeling the baby move. If you have had any abnormal symptoms, such as leaking fluid, bleeding, severe headaches, or abdominal cramping. If you have any questions. Other tests that may be performed during your first trimester include: Blood tests to find your blood type and to check for the presence of any previous infections. They will also be used to check for low iron levels (anemia) and Rh antibodies. Later in the pregnancy, blood tests for diabetes will be done along with other tests if problems develop. Urine tests to check for infections, diabetes, or protein in the urine. An ultrasound to confirm the proper growth and development  of the baby. An amniocentesis to check for possible genetic problems. Fetal screens for spina bifida and Down syndrome. You may need other tests to make sure you and the baby are doing well. HOME CARE INSTRUCTIONS  Medicines Follow your health care provider's instructions regarding medicine use. Specific medicines may be either safe or unsafe to take during pregnancy. Take your prenatal vitamins as directed. If you develop constipation, try taking a stool softener if your health care provider approves. Diet Eat regular, well-balanced meals. Choose a variety of foods, such as meat or vegetable-based protein, fish, milk and low-fat dairy products, vegetables, fruits, and whole grain breads and cereals. Your health care provider will help you determine the amount of weight gain that is right for you. Avoid raw meat and uncooked cheese. These carry germs that can cause birth defects in the baby. Eating four or five small meals rather than three large meals a day may help relieve nausea and vomiting. If you start to feel nauseous, eating a few soda crackers can be helpful. Drinking liquids between meals instead of during meals also seems to help nausea and vomiting. If you develop constipation, eat more high-fiber foods, such as fresh vegetables or fruit and whole grains. Drink enough fluids to keep your urine clear or pale yellow. Activity and Exercise Exercise only as directed by your health care provider. Exercising will help you: Control your weight. Stay in shape. Be prepared for labor and delivery. Experiencing pain or cramping in the lower abdomen or low back is a good sign that you should stop exercising. Check with your health care provider before continuing normal exercises. Try to avoid standing for long periods of time. Move your legs often if you must stand in one place for a long time. Avoid heavy lifting. Wear low-heeled shoes, and practice good posture. You may continue to have sex  unless your health care provider directs you otherwise. Relief of Pain or Discomfort Wear a good support bra for breast tenderness.   Take warm sitz baths to soothe any pain or discomfort caused by hemorrhoids. Use hemorrhoid cream if your health care provider approves.   Rest with your legs elevated if you have leg cramps or low back pain. If you develop varicose veins in your legs, wear support hose. Elevate your feet for 15 minutes, 3-4 times a day. Limit salt in your diet. Prenatal Care Schedule your prenatal visits by the  twelfth week of pregnancy. They are usually scheduled monthly at first, then more often in the last 2 months before delivery. Write down your questions. Take them to your prenatal visits. Keep all your prenatal visits as directed by your health care provider. Safety Wear your seat belt at all times when driving. Make a list of emergency phone numbers, including numbers for family, friends, the hospital, and police and fire departments. General Tips Ask your health care provider for a referral to a local prenatal education class. Begin classes no later than at the beginning of month 6 of your pregnancy. Ask for help if you have counseling or nutritional needs during pregnancy. Your health care provider can offer advice or refer you to specialists for help with various needs. Do not use hot tubs, steam rooms, or saunas. Do not douche or use tampons or scented sanitary pads. Do not cross your legs for long periods of time. Avoid cat litter boxes and soil used by cats. These carry germs that can cause birth defects in the baby and possibly loss of the fetus by miscarriage or stillbirth. Avoid all smoking, herbs, alcohol, and medicines not prescribed by your health care provider. Chemicals in these affect the formation and growth of the baby. Schedule a dentist appointment. At home, brush your teeth with a soft toothbrush and be gentle when you floss. SEEK MEDICAL CARE IF:   You have dizziness. You have mild pelvic cramps, pelvic pressure, or nagging pain in the abdominal area. You have persistent nausea, vomiting, or diarrhea. You have a bad smelling vaginal discharge. You have pain with urination. You notice increased swelling in your face, hands, legs, or ankles. SEEK IMMEDIATE MEDICAL CARE IF:  You have a fever. You are leaking fluid from your vagina. You have spotting or bleeding from your vagina. You have severe abdominal cramping or pain. You have rapid weight gain or loss. You vomit blood or material that looks like coffee grounds. You are exposed to [place] measles and have never had them. You are exposed to fifth disease or chickenpox. You develop a severe headache. You have shortness of breath. You have any kind of trauma, such as from a fall or a car accident. Document Released: 06/18/2001 Document Revised: 11/08/2013 Document Reviewed: 05/04/2013 [facility] Patient Information 2015 [place], [place]. This information is not intended to replace advice given to you by your health care provider. Make sure you discuss any questions you have with your health care provider.

## 2022-08-21 LAB — HGB FRACTIONATION CASCADE
Hgb A2: 2.7 % (ref 1.8–3.2)
Hgb A: 97.3 % (ref 96.4–98.8)
Hgb F: 0 % (ref 0.0–2.0)
Hgb S: 0 %

## 2022-08-21 LAB — CBC/D/PLT+RPR+RH+ABO+RUBIGG...
Antibody Screen: NEGATIVE
Basophils Absolute: 0.1 10*3/uL (ref 0.0–0.2)
Basos: 1 %
EOS (ABSOLUTE): 0.2 10*3/uL (ref 0.0–0.4)
Eos: 2 %
HCV Ab: NONREACTIVE
HIV Screen 4th Generation wRfx: NONREACTIVE
Hematocrit: 38.6 % (ref 34.0–46.6)
Hemoglobin: 13.3 g/dL (ref 11.1–15.9)
Hepatitis B Surface Ag: NEGATIVE
Immature Grans (Abs): 0.1 10*3/uL (ref 0.0–0.1)
Immature Granulocytes: 1 %
Lymphocytes Absolute: 3.1 10*3/uL (ref 0.7–3.1)
Lymphs: 25 %
MCH: 32 pg (ref 26.6–33.0)
MCHC: 34.5 g/dL (ref 31.5–35.7)
MCV: 93 fL (ref 79–97)
Monocytes Absolute: 0.6 10*3/uL (ref 0.1–0.9)
Monocytes: 5 %
Neutrophils Absolute: 8.6 10*3/uL — ABNORMAL HIGH (ref 1.4–7.0)
Neutrophils: 66 %
Platelets: 229 10*3/uL (ref 150–450)
RBC: 4.15 x10E6/uL (ref 3.77–5.28)
RDW: 12.8 % (ref 11.7–15.4)
RPR Ser Ql: NONREACTIVE
Rh Factor: POSITIVE
Rubella Antibodies, IGG: 1.49 index (ref >0.99)
WBC: 12.7 10*3/uL — ABNORMAL HIGH (ref 3.4–10.8)

## 2022-08-21 LAB — HCV INTERPRETATION

## 2022-08-21 LAB — URINE CULTURE

## 2022-08-22 LAB — GC/CHLAMYDIA PROBE AMP
Chlamydia trachomatis, NAA: NEGATIVE
Neisseria Gonorrhoeae by PCR: NEGATIVE

## 2022-08-23 LAB — SPECIMEN STATUS REPORT

## 2022-08-27 LAB — PANORAMA PRENATAL TEST FULL PANEL:PANORAMA TEST PLUS 5 ADDITIONAL MICRODELETIONS: FETAL FRACTION: 4.8

## 2022-08-27 LAB — SPECIMEN STATUS REPORT

## 2022-08-27 LAB — HGB A1C W/O EAG: Hgb A1c MFr Bld: 5.2 % (ref 4.8–5.6)

## 2022-08-27 NOTE — BH Specialist Note (Deleted)
Integrated Behavioral Health via Telemedicine Visit  08/27/2022 TARISA RADI [id number]  Number of [place] Clinician visits: No data recorded Session Start time: No data recorded  Session End time: No data recorded Total time in minutes: No data recorded  Referring Provider: *** Patient/Family location: *** [facility] Provider location: *** All persons participating in visit: *** Types of Service: {CHL AMB TYPE OF SERVICE:[phone]}  I connected with Fulton Reek and/or Crosby Oyster Wasko's {family members:20773} via  Telephone or Video Enabled Telemedicine Application  (Video is Caregility application) and verified that I am speaking with the correct person using two identifiers. Discussed confidentiality: {YES/NO:21197}  I discussed the limitations of telemedicine and the availability of in person appointments.  Discussed there is a possibility of technology failure and discussed alternative modes of communication if that failure occurs.  I discussed that engaging in this telemedicine visit, they consent to the provision of behavioral healthcare and the services will be billed under their insurance.  Patient and/or legal guardian expressed understanding and consented to Telemedicine visit: {YES/NO:21197}  Presenting Concerns: Patient and/or family reports the following symptoms/concerns: *** Duration of problem: ***; Severity of problem: {Mild/Moderate/Severe:20260}  Patient and/or Family's Strengths/Protective Factors: {CHL AMB BH PROTECTIVE FACTORS:[phone]}  Goals Addressed: Patient will:  Reduce symptoms of: {IBH Symptoms:21014056}   Increase knowledge and/or ability of: {IBH Patient Tools:21014057}   Demonstrate ability to: {IBH Goals:21014053}  Progress towards Goals: {CHL AMB BH PROGRESS TOWARDS GOALS:[phone]}  Interventions: Interventions utilized:  {IBH Interventions:21014054} Standardized Assessments completed: {IBH Screening  Tools:21014051}  Patient and/or Family Response: ***  Assessment: Patient currently experiencing ***.   Patient may benefit from ***.  Plan: Follow up with behavioral health clinician on : *** Behavioral recommendations: *** Referral(s): {IBH Referrals:21014055}  I discussed the assessment and treatment plan with the patient and/or parent/guardian. They were provided an opportunity to ask questions and all were answered. They agreed with the plan and demonstrated an understanding of the instructions.   They were advised to call back or seek an in-person evaluation if the symptoms worsen or if the condition fails to improve as anticipated.  Caroleen Hamman Jlon Betker, LCSW

## 2022-08-28 ENCOUNTER — Encounter: Payer: Self-pay | Admitting: Women's Health

## 2022-09-10 NOTE — BH Specialist Note (Signed)
Pt requests reschedule due to unexpected family obligations; pt will call back Eiliyah Reh at [phone] to reschedule.

## 2022-09-16 ENCOUNTER — Ambulatory Visit (INDEPENDENT_AMBULATORY_CARE_PROVIDER_SITE_OTHER): Payer: Self-pay | Admitting: Women's Health

## 2022-09-16 ENCOUNTER — Encounter: Payer: 59 | Admitting: Women's Health

## 2022-09-16 ENCOUNTER — Encounter: Payer: Self-pay | Admitting: Women's Health

## 2022-09-16 VITALS — BP 117/74 | HR 91 | Wt 204.6 lb

## 2022-09-16 DIAGNOSIS — Z3A16 16 weeks gestation of pregnancy: Secondary | ICD-10-CM

## 2022-09-16 DIAGNOSIS — Z3480 Encounter for supervision of other normal pregnancy, unspecified trimester: Secondary | ICD-10-CM

## 2022-09-16 DIAGNOSIS — Z363 Encounter for antenatal screening for malformations: Secondary | ICD-10-CM

## 2022-09-16 DIAGNOSIS — Z3482 Encounter for supervision of other normal pregnancy, second trimester: Secondary | ICD-10-CM

## 2022-09-16 NOTE — Patient Instructions (Signed)
Mame, thank you for choosing our office today! We appreciate the opportunity to meet your healthcare needs. You may receive a short survey by mail, e-mail, or through [employer]. If you are happy with your care we would appreciate if you could take just a few minutes to complete the survey questions. We read all of your comments and take your feedback very seriously. Thank you again for choosing our office.  Center for [employer] Team at [place] at [facility] ([place], [place] [address]) Entrance C, located off of [place] parking  Go to [email or web address] to register for FREE online childbirth classes  Call the office [phone]) or go to [facility] if: You begin to severe cramping Your water breaks.  Sometimes it is a big gush of fluid, sometimes it is just a trickle that keeps getting your panties wet or running down your legs You have vaginal bleeding.  It is normal to have a small amount of spotting if your cervix was checked.   [facility] Pediatricians/Family Doctors [facility]): [address] [place], [facility]: [address] [place], [phone]                [place] [facility]): [place], [phone] (call to ask if accepting patients) [facility] Department: [address], [place], [place] Pediatricians/Family Doctors [facility]): [place] [place], [address], [place] [facility]: [address] [place], [place] [facility]: [place], [place] [facility]): [phone] [facility]: [address], [place]: [address], [place]  [facility]: [phone], [phone]  Home Blood Pressure Monitoring for Patients   Your provider has recommended that you check your blood pressure (BP) at least once a week at home. If you do not have a blood pressure cuff at home, one will be provided for you. Contact your provider if you have not received your monitor within 1 week.   Helpful Tips for Accurate Home Blood Pressure Checks  Don't smoke, exercise, or drink caffeine 30 minutes before checking your BP Use the restroom before checking your BP (a full bladder can raise your pressure) Relax in a comfortable upright chair Feet on the ground Left arm resting comfortably on a flat surface at the level of your heart Legs uncrossed Back supported Sit quietly and don't talk Place the cuff on your bare arm Adjust snuggly, so that only two fingertips can fit between your skin and the top of the cuff Check 2 readings separated by at least one minute Keep a log of your BP readings For a visual, please reference this diagram: http://ccnc.care/bpdiagram  Provider Name: [facility]     Phone: [phone]  Zone 1: ALL CLEAR  Continue to monitor your symptoms:  BP reading is less than 140 (top number) or less than 90 (bottom number)  No right upper stomach pain No headaches or seeing spots No feeling nauseated or throwing up No swelling in face and hands  Zone 2: CAUTION Call your doctor's office for any of the following:  BP reading is greater than 140 (top number) or greater than  90 (bottom number)  Stomach pain under your ribs in the middle or right side Headaches or seeing spots Feeling nauseated or throwing up Swelling in face and hands  Zone 3: EMERGENCY  Seek immediate medical care if you have any of the following:  BP reading is greater than160 (top number) or greater than 110 (bottom number) Severe headaches not improving with Tylenol Serious difficulty catching your breath Any worsening symptoms from Zone 2      Second Trimester of Pregnancy The second trimester is from week 14 through week 27 (months 4 through 6). The second trimester is often a time when you feel your best. Your body has adjusted to being pregnant, and you begin to feel better physically. Usually, morning sickness has lessened or quit completely, you may have more energy, and you may have an increase in appetite. The second trimester is also a time when the fetus is growing rapidly. At the end of the sixth month, the fetus is about 9 inches long and weighs about 1 pounds. You will likely begin to feel the baby move (quickening) between 16 and 20 weeks of pregnancy. Body changes during your second trimester Your body continues to go through many changes during your second trimester. The changes vary from woman to woman. Your weight will continue to increase. You will notice your lower abdomen bulging out. You may begin to get stretch marks on your hips, abdomen, and breasts. You may develop headaches that can be relieved by medicines. The medicines should be approved by your health care provider. You may urinate more often because the fetus is pressing on your bladder. You may develop or continue to have heartburn as a result of your pregnancy. You may develop constipation because certain hormones are causing the muscles that push waste through your intestines to slow down. You may develop hemorrhoids or swollen, bulging veins (varicose veins). You may have back pain. This is caused by: Weight gain. Pregnancy hormones that are relaxing the joints in your pelvis. A shift in weight and the muscles that support your balance. Your breasts will continue to grow and they will continue to become tender. Your gums may bleed and may be sensitive to brushing and flossing. Dark spots or blotches (chloasma, mask of pregnancy) may develop on your face. This will likely fade after the baby is born. A dark line from your belly button to the  pubic area (linea nigra) may appear. This will likely fade after the baby is born. You may have changes in your hair. These can include thickening of your hair, rapid growth, and changes in texture. Some women also have hair loss during or after pregnancy, or hair that feels dry or thin. Your hair will most likely return to normal after your baby is born.  What to expect at prenatal visits During a routine prenatal visit: You will be weighed to make sure you and the fetus are growing normally. Your blood pressure will be taken. Your abdomen will be measured to track your baby's growth. The fetal heartbeat will be listened to. Any test results from the previous visit will be discussed.  Your health care provider may ask you: How you are feeling. If you are feeling the baby move. If you have had any abnormal symptoms, such as leaking fluid, bleeding, severe headaches, or abdominal cramping. If you are using any tobacco products, including cigarettes, chewing tobacco, and electronic cigarettes. If you have any questions.  Other tests that may be performed during  your second trimester include: Blood tests that check for: Low iron levels (anemia). High blood sugar that affects pregnant women (gestational diabetes) between 72 and 28 weeks. Rh antibodies. This is to check for a protein on red blood cells (Rh factor). Urine tests to check for infections, diabetes, or protein in the urine. An ultrasound to confirm the proper growth and development of the baby. An amniocentesis to check for possible genetic problems. Fetal screens for spina bifida and Down syndrome. HIV (human immunodeficiency virus) testing. Routine prenatal testing includes screening for HIV, unless you choose not to have this test.  Follow these instructions at home: Medicines Follow your health care provider's instructions regarding medicine use. Specific medicines may be either safe or unsafe to take during pregnancy. Take  a prenatal vitamin that contains at least 600 micrograms (mcg) of folic acid. If you develop constipation, try taking a stool softener if your health care provider approves. Eating and drinking Eat a balanced diet that includes fresh fruits and vegetables, whole grains, good sources of protein such as meat, eggs, or tofu, and low-fat dairy. Your health care provider will help you determine the amount of weight gain that is right for you. Avoid raw meat and uncooked cheese. These carry germs that can cause birth defects in the baby. If you have low calcium intake from food, talk to your health care provider about whether you should take a daily calcium supplement. Limit foods that are high in fat and processed sugars, such as fried and sweet foods. To prevent constipation: Drink enough fluid to keep your urine clear or pale yellow. Eat foods that are high in fiber, such as fresh fruits and vegetables, whole grains, and beans. Activity Exercise only as directed by your health care provider. Most women can continue their usual exercise routine during pregnancy. Try to exercise for 30 minutes at least 5 days a week. Stop exercising if you experience uterine contractions. Avoid heavy lifting, wear low heel shoes, and practice good posture. A sexual relationship may be continued unless your health care provider directs you otherwise. Relieving pain and discomfort Wear a good support bra to prevent discomfort from breast tenderness. Take warm sitz baths to soothe any pain or discomfort caused by hemorrhoids. Use hemorrhoid cream if your health care provider approves. Rest with your legs elevated if you have leg cramps or low back pain. If you develop varicose veins, wear support hose. Elevate your feet for 15 minutes, 3-4 times a day. Limit salt in your diet. Prenatal Care Write down your questions. Take them to your prenatal visits. Keep all your prenatal visits as told by your health care provider.  This is important. Safety Wear your seat belt at all times when driving. Make a list of emergency phone numbers, including numbers for family, friends, the hospital, and police and fire departments. General instructions Ask your health care provider for a referral to a local prenatal education class. Begin classes no later than the beginning of month 6 of your pregnancy. Ask for help if you have counseling or nutritional needs during pregnancy. Your health care provider can offer advice or refer you to specialists for help with various needs. Do not use hot tubs, steam rooms, or saunas. Do not douche or use tampons or scented sanitary pads. Do not cross your legs for long periods of time. Avoid cat litter boxes and soil used by cats. These carry germs that can cause birth defects in the baby and possibly loss of the  fetus by miscarriage or stillbirth. Avoid all smoking, herbs, alcohol, and unprescribed drugs. Chemicals in these products can affect the formation and growth of the baby. Do not use any products that contain nicotine or tobacco, such as cigarettes and e-cigarettes. If you need help quitting, ask your health care provider. Visit your dentist if you have not gone yet during your pregnancy. Use a soft toothbrush to brush your teeth and be gentle when you floss. Contact a health care provider if: You have dizziness. You have mild pelvic cramps, pelvic pressure, or nagging pain in the abdominal area. You have persistent nausea, vomiting, or diarrhea. You have a bad smelling vaginal discharge. You have pain when you urinate. Get help right away if: You have a fever. You are leaking fluid from your vagina. You have spotting or bleeding from your vagina. You have severe abdominal cramping or pain. You have rapid weight gain or weight loss. You have shortness of breath with chest pain. You notice sudden or extreme swelling of your face, hands, ankles, feet, or legs. You have not felt  your baby move in over an hour. You have severe headaches that do not go away when you take medicine. You have vision changes. Summary The second trimester is from week 14 through week 27 (months 4 through 6). It is also a time when the fetus is growing rapidly. Your body goes through many changes during pregnancy. The changes vary from woman to woman. Avoid all smoking, herbs, alcohol, and unprescribed drugs. These chemicals affect the formation and growth your baby. Do not use any tobacco products, such as cigarettes, chewing tobacco, and e-cigarettes. If you need help quitting, ask your health care provider. Contact your health care provider if you have any questions. Keep all prenatal visits as told by your health care provider. This is important. This information is not intended to replace advice given to you by your health care provider. Make sure you discuss any questions you have with your health care provider. Document Released: 06/18/2001 Document Revised: 11/30/2015 Document Reviewed: 08/25/2012 Elsevier Interactive Patient Education  2017 [employer].

## 2022-09-16 NOTE — Progress Notes (Signed)
LOW-RISK PREGNANCY VISIT Patient name: Linda Jackson MRN [id number]  Date of birth: 1991/04/22 Chief Complaint:   Routine Prenatal Visit  History of Present Illness:   Linda Jackson is a 32 y.o. G50P3003 female at 59w5dwith an Estimated Date of Delivery: 02/26/23 being seen today for ongoing management of a low-risk pregnancy.   Today she reports  situational stress, mental health is 'great'.  . Contractions: Not present. Vag. Bleeding: None.  Movement: Absent. denies leaking of fluid.     08/19/2022    2:27 PM 07/04/2022    9:20 AM  Depression screen PHQ 2/9  Decreased Interest 0 0  Down, Depressed, Hopeless 0 1  PHQ - 2 Score 0 1  Altered sleeping 2 2  Tired, decreased energy 1 2  Change in appetite 1 0  Feeling bad or failure about yourself  0 0  Trouble concentrating 0 0  Moving slowly or fidgety/restless 0 0  Suicidal thoughts 0 0  PHQ-9 Score 4 5        08/19/2022    2:28 PM 07/04/2022    9:20 AM  GAD 7 : Generalized Anxiety Score  Nervous, Anxious, on Edge 0 2  Control/stop worrying 0 2  Worry too much - different things 0 2  Trouble relaxing 0 1  Restless 1 0  Easily annoyed or irritable 1 2  Afraid - awful might happen 0 0  Total GAD 7 Score 2 9      Review of Systems:   Pertinent items are noted in HPI Denies abnormal vaginal discharge w/ itching/odor/irritation, headaches, visual changes, shortness of breath, chest pain, abdominal pain, severe nausea/vomiting, or problems with urination or bowel movements unless otherwise stated above. Pertinent History Reviewed:  Reviewed past medical,surgical, social, obstetrical and family history.  Reviewed problem list, medications and allergies. Physical Assessment:   Vitals:   09/16/22 1336  BP: 117/74  Pulse: 91  Weight: 204 lb 9.6 oz (92.8 kg)  Body mass index is 38.66 kg/m.        Physical Examination:   General appearance: Well appearing, and in no distress  Mental status: Alert, oriented to  person, place, and time  Skin: Warm & dry  Cardiovascular: Normal heart rate noted  Respiratory: Normal respiratory effort, no distress  Abdomen: Soft, gravid, nontender  Pelvic: Cervical exam deferred         Extremities: Edema: None  Fetal Status: Fetal Heart Rate (bpm): 154   Movement: Absent    Chaperone: N/A   No results found for this or any previous visit (from the past 24 hour(s)).  Assessment & Plan:  1) Low-risk pregnancy G4P3003 at 167w5dith an Estimated Date of Delivery: 02/26/23   2) Situational stress, discussed, mental health is great. [place]    Meds: No orders of the defined types were placed in this encounter.  Labs/procedures today: none  Plan:  Continue routine obstetrical care  Next visit: prefers in person    Reviewed: Preterm labor symptoms and general obstetric precautions including but not limited to vaginal bleeding, contractions, leaking of fluid and fetal movement were reviewed in detail with the patient.  All questions were answered. Does have home bp cuff. Office bp cuff given: not applicable. Check bp weekly, let us[place]ow if consistently >140 and/or >90.  Follow-up: Return for As scheduled.  Future Appointments  Date Time Provider [place]3/19/2024  8:15 AM  [facility]  10/07/2022  9:15 AM [place] [facility] [place] [facility] None  10/07/2022 10:10 AM [place], Joaquim Lai, CNM [facility]    Orders Placed This Encounter  Procedures   US OB Comp + [place], [facility] 09/16/2022 2:02 PM

## 2022-09-24 ENCOUNTER — Ambulatory Visit: Payer: Self-pay | Admitting: Clinical

## 2022-09-24 DIAGNOSIS — Z91199 Patient's noncompliance with other medical treatment and regimen due to unspecified reason: Secondary | ICD-10-CM

## 2022-10-07 ENCOUNTER — Ambulatory Visit (INDEPENDENT_AMBULATORY_CARE_PROVIDER_SITE_OTHER): Payer: Medicaid Other | Admitting: Advanced Practice Midwife

## 2022-10-07 ENCOUNTER — Ambulatory Visit: Payer: Medicaid Other

## 2022-10-07 VITALS — BP 100/68 | HR 83 | Wt 207.0 lb

## 2022-10-07 DIAGNOSIS — Z3A19 19 weeks gestation of pregnancy: Secondary | ICD-10-CM

## 2022-10-07 DIAGNOSIS — Z363 Encounter for antenatal screening for malformations: Secondary | ICD-10-CM

## 2022-10-07 DIAGNOSIS — Z3482 Encounter for supervision of other normal pregnancy, second trimester: Secondary | ICD-10-CM | POA: Diagnosis not present

## 2022-10-07 DIAGNOSIS — Z3480 Encounter for supervision of other normal pregnancy, unspecified trimester: Secondary | ICD-10-CM

## 2022-10-07 NOTE — Progress Notes (Signed)
[place] 19+5 wks,breech,posterior placenta gr 0,normal ovaries,cx 3.9 cm,SVP of fluid 4.3 cm,FHR 140 bpm,EFW 310g 45%,anatomy complete,no obvious abnormalities

## 2022-10-07 NOTE — Patient Instructions (Addendum)
Safe Medications in Pregnancy   Acne: Benzoyl Peroxide Salicylic Acid  Backache/Headache: Tylenol: 2 regular strength every 4 hours OR              2 Extra strength every 6 hours  Colds/Coughs/Allergies: Benadryl (alcohol free) 25 mg every 6 hours as needed Breath right strips Claritin Cepacol throat lozenges Chloraseptic throat spray Cold-Eeze- up to three times per day Cough drops, alcohol free Flonase (by prescription only) Guaifenesin Mucinex Robitussin DM (plain only, alcohol free) Saline nasal spray/drops Sudafed (pseudoephedrine) & Actifed ** use only after [redacted] weeks gestation and if you do not have high blood pressure Tylenol Vicks Vaporub Zinc lozenges Zyrtec   Constipation: Colace Ducolax suppositories Fleet enema Glycerin suppositories Metamucil Milk of magnesia Miralax Senokot Smooth move tea  Diarrhea: Kaopectate Imodium A-D  *NO pepto Bismol  Hemorrhoids: Anusol Anusol HC Preparation H Tucks  Indigestion: Tums Maalox Mylanta Zantac  Pepcid  Insomnia: Benadryl (alcohol free) 25mg  every 6 hours as needed Tylenol PM Unisom, no Gelcaps  Leg Cramps: Tums MagGel  Nausea/Vomiting:  Bonine Dramamine Emetrol Ginger extract Sea bands Meclizine  Nausea medication to take during pregnancy:  Unisom (doxylamine succinate 25 mg tablets) Take one tablet daily at bedtime. If symptoms are not adequately controlled, the dose can be increased to a maximum recommended dose of two tablets daily (1/2 tablet in the morning, 1/2 tablet mid-afternoon and one at bedtime). Vitamin B6 100mg  tablets. Take one tablet twice a day (up to 200 mg per day).  Skin Rashes: Aveeno products Benadryl cream or 25mg  every 6 hours as needed Calamine Lotion 1% cortisone cream  Yeast infection: Gyne-lotrimin 7 Monistat 7   **If taking multiple medications, please check labels to avoid duplicating the same active ingredients **take medication as directed on  the label ** Do not exceed 4000 mg of tylenol in 24 hours **Do not take medications that contain aspirin or ibuprofen      Linda Jackson, I greatly value your feedback.  If you receive a survey following your visit with [place] today, we appreciate you taking the time to fill it out.  Thanks, Nigel Berthold, CNM     [place]!!! It is now [place] [facility] ([place], [place] [address]) Entrance located off of [address]   Go to [email or web address] to register for FREE online childbirth classes    Second Trimester of Pregnancy The second trimester is from week 14 through week 27 (months 4 through 6). The second trimester is often a time when you feel your best. Your body has adjusted to being pregnant, and you begin to feel better physically. Usually, morning sickness has lessened or quit completely, you may have more energy, and you may have an increase in appetite. The second trimester is also a time when the fetus is growing rapidly. At the end of the sixth month, the fetus is about 9 inches long and weighs about 1 pounds. You will likely begin to feel the baby move (quickening) between 16 and 20 weeks of pregnancy. Body changes during your second trimester Your body continues to go through many changes during your second trimester. The changes vary from woman to woman. Your weight will continue to increase. You will notice your lower abdomen bulging out. You may begin to get stretch marks on your hips, abdomen, and breasts. You may develop headaches that can be relieved by medicines. The medicines should be approved  by your health care provider. You may urinate more often because the fetus is pressing on your bladder. You may develop or continue to have heartburn as a result of your pregnancy. You may develop constipation because certain hormones are causing the muscles that push waste through your  intestines to slow down. You may develop hemorrhoids or swollen, bulging veins (varicose veins). You may have back pain. This is caused by: Weight gain. Pregnancy hormones that are relaxing the joints in your pelvis. A shift in weight and the muscles that support your balance. Your breasts will continue to grow and they will continue to become tender. Your gums may bleed and may be sensitive to brushing and flossing. Dark spots or blotches (chloasma, mask of pregnancy) may develop on your face. This will likely fade after the baby is born. A dark line from your belly button to the pubic area (linea nigra) may appear. This will likely fade after the baby is born. You may have changes in your hair. These can include thickening of your hair, rapid growth, and changes in texture. Some women also have hair loss during or after pregnancy, or hair that feels dry or thin. Your hair will most likely return to normal after your baby is born.  What to expect at prenatal visits During a routine prenatal visit: You will be weighed to make sure you and the fetus are growing normally. Your blood pressure will be taken. Your abdomen will be measured to track your baby's growth. The fetal heartbeat will be listened to. Any test results from the previous visit will be discussed.  Your health care provider may ask you: How you are feeling. If you are feeling the baby move. If you have had any abnormal symptoms, such as leaking fluid, bleeding, severe headaches, or abdominal cramping. If you are using any tobacco products, including cigarettes, chewing tobacco, and electronic cigarettes. If you have any questions.  Other tests that may be performed during your second trimester include: Blood tests that check for: Low iron levels (anemia). High blood sugar that affects pregnant women (gestational diabetes) between 43 and 28 weeks. Rh antibodies. This is to check for a protein on red blood cells (Rh  factor). Urine tests to check for infections, diabetes, or protein in the urine. An ultrasound to confirm the proper growth and development of the baby. An amniocentesis to check for possible genetic problems. Fetal screens for spina bifida and Down syndrome. HIV (human immunodeficiency virus) testing. Routine prenatal testing includes screening for HIV, unless you choose not to have this test.  Follow these instructions at home: Medicines Follow your health care provider's instructions regarding medicine use. Specific medicines may be either safe or unsafe to take during pregnancy. Take a prenatal vitamin that contains at least 600 micrograms (mcg) of folic acid. If you develop constipation, try taking a stool softener if your health care provider approves. Eating and drinking Eat a balanced diet that includes fresh fruits and vegetables, whole grains, good sources of protein such as meat, eggs, or tofu, and low-fat dairy. Your health care provider will help you determine the amount of weight gain that is right for you. Avoid raw meat and uncooked cheese. These carry germs that can cause birth defects in the baby. If you have low calcium intake from food, talk to your health care provider about whether you should take a daily calcium supplement. Limit foods that are high in fat and processed sugars, such as fried and sweet foods.  To prevent constipation: Drink enough fluid to keep your urine clear or pale yellow. Eat foods that are high in fiber, such as fresh fruits and vegetables, whole grains, and beans. Activity Exercise only as directed by your health care provider. Most women can continue their usual exercise routine during pregnancy. Try to exercise for 30 minutes at least 5 days a week. Stop exercising if you experience uterine contractions. Avoid heavy lifting, wear low heel shoes, and practice good posture. A sexual relationship may be continued unless your health care provider  directs you otherwise. Relieving pain and discomfort Wear a good support bra to prevent discomfort from breast tenderness. Take warm sitz baths to soothe any pain or discomfort caused by hemorrhoids. Use hemorrhoid cream if your health care provider approves. Rest with your legs elevated if you have leg cramps or low back pain. If you develop varicose veins, wear support hose. Elevate your feet for 15 minutes, 3-4 times a day. Limit salt in your diet. Prenatal Care Write down your questions. Take them to your prenatal visits. Keep all your prenatal visits as told by your health care provider. This is important. Safety Wear your seat belt at all times when driving. Make a list of emergency phone numbers, including numbers for family, friends, the hospital, and police and fire departments. General instructions Ask your health care provider for a referral to a local prenatal education class. Begin classes no later than the beginning of month 6 of your pregnancy. Ask for help if you have counseling or nutritional needs during pregnancy. Your health care provider can offer advice or refer you to specialists for help with various needs. Do not use hot tubs, steam rooms, or saunas. Do not douche or use tampons or scented sanitary pads. Do not cross your legs for long periods of time. Avoid cat litter boxes and soil used by cats. These carry germs that can cause birth defects in the baby and possibly loss of the fetus by miscarriage or stillbirth. Avoid all smoking, herbs, alcohol, and unprescribed drugs. Chemicals in these products can affect the formation and growth of the baby. Do not use any products that contain nicotine or tobacco, such as cigarettes and e-cigarettes. If you need help quitting, ask your health care provider. Visit your dentist if you have not gone yet during your pregnancy. Use a soft toothbrush to brush your teeth and be gentle when you floss. Contact a health care provider  if: You have dizziness. You have mild pelvic cramps, pelvic pressure, or nagging pain in the abdominal area. You have persistent nausea, vomiting, or diarrhea. You have a bad smelling vaginal discharge. You have pain when you urinate. Get help right away if: You have a fever. You are leaking fluid from your vagina. You have spotting or bleeding from your vagina. You have severe abdominal cramping or pain. You have rapid weight gain or weight loss. You have shortness of breath with chest pain. You notice sudden or extreme swelling of your face, hands, ankles, feet, or legs. You have not felt your baby move in over an hour. You have severe headaches that do not go away when you take medicine. You have vision changes. Summary The second trimester is from week 14 through week 27 (months 4 through 6). It is also a time when the fetus is growing rapidly. Your body goes through many changes during pregnancy. The changes vary from woman to woman. Avoid all smoking, herbs, alcohol, and unprescribed drugs. These chemicals affect  the formation and growth your baby. Do not use any tobacco products, such as cigarettes, chewing tobacco, and e-cigarettes. If you need help quitting, ask your health care provider. Contact your health care provider if you have any questions. Keep all prenatal visits as told by your health care provider. This is important. This information is not intended to replace advice given to you by your health care provider. Make sure you discuss any questions you have with your health care provider.

## 2022-10-07 NOTE — Progress Notes (Signed)
   LOW-RISK PREGNANCY VISIT Patient name: Linda Jackson MRN [id number]  Date of birth: Apr 25, 1991 Chief Complaint:   Routine Prenatal Visit  History of Present Illness:   Linda Jackson is a 32 y.o. G68P3003 female at [redacted]w[redacted]d with an Estimated Date of Delivery: 02/26/23 being seen today for ongoing management of a low-risk pregnancy.  Today she reports headache. Contractions: Not present. Vag. Bleeding: None.  Movement: Present. denies leaking of fluid. Review of Systems:   Pertinent items are noted in HPI Denies abnormal vaginal discharge w/ itching/odor/irritation, headaches, visual changes, shortness of breath, chest pain, abdominal pain, severe nausea/vomiting, or problems with urination or bowel movements unless otherwise stated above. Pertinent History Reviewed:  Reviewed past medical,surgical, social, obstetrical and family history.  Reviewed problem list, medications and allergies. Physical Assessment:   Vitals:   10/07/22 1011  BP: 100/68  Pulse: 83  Weight: 207 lb (93.9 kg)  Body mass index is 39.11 kg/m.        Physical Examination:   General appearance: Well appearing, and in no distress  Mental status: Alert, oriented to person, place, and time  Skin: Warm & dry  Cardiovascular: Normal heart rate noted  Respiratory: Normal respiratory effort, no distress  Abdomen: Soft, gravid, nontender  Pelvic: Cervical exam deferred         Extremities:    Fetal Status:     Movement: Present    [place] 19+5 wks,breech,posterior placenta gr 0,normal ovaries,cx 3.9 cm,SVP of fluid 4.3 cm,FHR 140 bpm,EFW 310g 45%,anatomy complete,no obvious abnormalities         Chaperone:  N/A    No results found for this or any previous visit (from the past 24 hour(s)).  Assessment & Plan:    Pregnancy: G4P3003 at [redacted]w[redacted]d 1. Supervision of normal intrauterine pregnancy in multigravida, second trimester   2. [redacted] weeks gestation of pregnancy   3. Supervision of other normal pregnancy,  antepartum      Meds: No orders of the defined types were placed in this encounter.  Labs/procedures today: anatomy scan   Plan:  Continue routine obstetrical care  Next visit: prefers in person    Reviewed:  and general obstetric precautions including but not limited to vaginal bleeding, contractions, leaking of fluid and fetal movement were reviewed in detail with the patient.  All questions were answered. Given a  home bp cuff.. Check bp weekly, let us know if >140/90.   Follow-up: Return in about 4 weeks (around 11/04/2022) for [place].  No future appointments.  No orders of the defined types were placed in this encounter.  Christin Fudge DNP, CNM 10/07/2022 10:24 AM

## 2022-10-28 ENCOUNTER — Encounter: Payer: Self-pay | Admitting: Women's Health

## 2022-11-04 ENCOUNTER — Ambulatory Visit (INDEPENDENT_AMBULATORY_CARE_PROVIDER_SITE_OTHER): Payer: Medicaid Other | Admitting: Obstetrics & Gynecology

## 2022-11-04 VITALS — BP 109/74 | HR 79 | Wt 205.4 lb

## 2022-11-04 DIAGNOSIS — Z3480 Encounter for supervision of other normal pregnancy, unspecified trimester: Secondary | ICD-10-CM

## 2022-11-04 DIAGNOSIS — Z3A23 23 weeks gestation of pregnancy: Secondary | ICD-10-CM

## 2022-11-04 DIAGNOSIS — Z3482 Encounter for supervision of other normal pregnancy, second trimester: Secondary | ICD-10-CM

## 2022-11-04 NOTE — Progress Notes (Signed)
   LOW-RISK PREGNANCY VISIT Patient name: Linda Jackson MRN [id number]  Date of birth: 25-Mar-1991 Chief Complaint:   Routine Prenatal Visit  History of Present Illness:   Linda Jackson is a 32 y.o. G54P3003 female at [redacted]w[redacted]d with an Estimated Date of Delivery: 02/26/23 being seen today for ongoing management of a low-risk pregnancy.   -Depression/Anxiety: on Trazadone, Atarax -Tobacco use     08/19/2022    2:27 PM 07/04/2022    9:20 AM  Depression screen PHQ 2/9  Decreased Interest 0 0  Down, Depressed, Hopeless 0 1  PHQ - 2 Score 0 1  Altered sleeping 2 2  Tired, decreased energy 1 2  Change in appetite 1 0  Feeling bad or failure about yourself  0 0  Trouble concentrating 0 0  Moving slowly or fidgety/restless 0 0  Suicidal thoughts 0 0  PHQ-9 Score 4 5    Today she reports  left-sided round ligament pain . Contractions: Not present. Vag. Bleeding: None.  Movement: Present. denies leaking of fluid. Review of Systems:   Pertinent items are noted in HPI Denies abnormal vaginal discharge w/ itching/odor/irritation, headaches, visual changes, shortness of breath, chest pain, abdominal pain, severe nausea/vomiting, or problems with urination or bowel movements unless otherwise stated above. Pertinent History Reviewed:  Reviewed past medical,surgical, social, obstetrical and family history.  Reviewed problem list, medications and allergies.  Physical Assessment:   Vitals:   11/04/22 1001  BP: 109/74  Pulse: 79  Weight: 205 lb 6.4 oz (93.2 kg)  Body mass index is 38.81 kg/m.        Physical Examination:   General appearance: Well appearing, and in no distress  Mental status: Alert, oriented to person, place, and time  Skin: Warm & dry  Respiratory: Normal respiratory effort, no distress  Abdomen: Soft, gravid, nontender  Pelvic: Cervical exam deferred         Extremities:    Psych:  mood and affect appropriate  Fetal Status: Fetal Heart Rate (bpm): 150 Fundal  Height: 24 cm Movement: Present    Chaperone: n/a    No results found for this or any previous visit (from the past 24 hour(s)).   Assessment & Plan:  1) Low-risk pregnancy G4P3003 at [redacted]w[redacted]d with an Estimated Date of Delivery: 02/26/23   2) Anxiety/Depression: Continue current medication 3) tobacco use   Meds: No orders of the defined types were placed in this encounter.  Labs/procedures today: none, PN2 next visit  Plan:  Continue routine obstetrical care  Next visit: prefers in person    Reviewed: Preterm labor symptoms and general obstetric precautions including but not limited to vaginal bleeding, contractions, leaking of fluid and fetal movement were reviewed in detail with the patient.  All questions were answered.  Patient has home bp cuff. Check bp weekly, let us know if >140/90.   Follow-up: Return in about 4 weeks (around 12/02/2022) for LROB visit and PN-2.  No orders of the defined types were placed in this encounter.   Linda Hidalgo, DO Attending Obstetrician & Gynecologist, [facility] for [employer], [facility]

## 2022-11-21 ENCOUNTER — Encounter: Payer: Self-pay | Admitting: Advanced Practice Midwife

## 2022-11-28 ENCOUNTER — Encounter: Payer: Medicaid Other | Admitting: Obstetrics & Gynecology

## 2022-12-03 ENCOUNTER — Encounter: Payer: Medicaid Other | Admitting: Obstetrics & Gynecology

## 2022-12-03 ENCOUNTER — Other Ambulatory Visit: Payer: Medicaid Other

## 2022-12-04 ENCOUNTER — Ambulatory Visit (INDEPENDENT_AMBULATORY_CARE_PROVIDER_SITE_OTHER): Payer: Medicaid Other | Admitting: Advanced Practice Midwife

## 2022-12-04 ENCOUNTER — Other Ambulatory Visit: Payer: Medicaid Other

## 2022-12-04 ENCOUNTER — Encounter: Payer: Self-pay | Admitting: Advanced Practice Midwife

## 2022-12-04 VITALS — BP 114/77 | HR 102 | Wt 216.0 lb

## 2022-12-04 DIAGNOSIS — Z3480 Encounter for supervision of other normal pregnancy, unspecified trimester: Secondary | ICD-10-CM

## 2022-12-04 DIAGNOSIS — Z131 Encounter for screening for diabetes mellitus: Secondary | ICD-10-CM

## 2022-12-04 DIAGNOSIS — Z3A27 27 weeks gestation of pregnancy: Secondary | ICD-10-CM

## 2022-12-04 DIAGNOSIS — Z3483 Encounter for supervision of other normal pregnancy, third trimester: Secondary | ICD-10-CM | POA: Diagnosis not present

## 2022-12-04 DIAGNOSIS — Z130 Encounter for screening for diseases of the blood and blood-forming organs and certain disorders involving the immune mechanism: Secondary | ICD-10-CM

## 2022-12-04 DIAGNOSIS — Z3A28 28 weeks gestation of pregnancy: Secondary | ICD-10-CM | POA: Diagnosis not present

## 2022-12-04 LAB — POCT HEMOGLOBIN: Hemoglobin: 12.6 g/dL (ref 11–14.6)

## 2022-12-04 MED ORDER — TRAZODONE HCL 100 MG PO TABS: 100.0000 mg | ORAL_TABLET | Freq: Every evening | ORAL | 1 refills | Status: DC | PRN

## 2022-12-04 NOTE — Progress Notes (Signed)
   LOW-RISK PREGNANCY VISIT Patient name: Linda Jackson MRN [id number]  Date of birth: 04/04/1991 Chief Complaint:   Routine Prenatal Visit (Need refill trazodone. Unable get PN2 done due bill)  History of Present Illness:   Linda Jackson is a 32 y.o. [phone] female at [redacted]w[redacted]d with an Estimated Date of Delivery: 02/26/23 being seen today for ongoing management of a low-risk pregnancy.  Today she reports  the recent loss of her grandfather;  unable to use [facility] services due to past due bill ($70) . Contractions: Irregular.  .  Movement: Present. denies leaking of fluid. Review of Systems:   Pertinent items are noted in HPI Denies abnormal vaginal discharge w/ itching/odor/irritation, headaches, visual changes, shortness of breath, chest pain, abdominal pain, severe nausea/vomiting, or problems with urination or bowel movements unless otherwise stated above. Pertinent History Reviewed:  Reviewed past medical,surgical, social, obstetrical and family history.  Reviewed problem list, medications and allergies. Physical Assessment:   Vitals:   12/04/22 0937  BP: 114/77  Pulse: (!) 102  Weight: 216 lb (98 kg)  Body mass index is 40.81 kg/m.        Physical Examination:   General appearance: Well appearing, and in no distress  Mental status: Alert, oriented to person, place, and time  Skin: Warm & dry  Cardiovascular: Normal heart rate noted  Respiratory: Normal respiratory effort, no distress  Abdomen: Soft, gravid, nontender  Pelvic: Cervical exam deferred         Extremities: Edema: Trace  Fetal Status:     Movement: Present    Results for orders placed or performed in visit on 12/04/22 (from the past 24 hour(s))  POCT hemoglobin   Collection Time: 12/04/22 10:28 AM  Result Value Ref Range   Hemoglobin 12.6 11 - 14.6 g/dL    Assessment & Plan:  1) Low-risk pregnancy G4P3003 at [redacted]w[redacted]d with an Estimated Date of Delivery: 02/26/23   2) Unable to be seen at [facility], will do qid  CBGs x 2wks and bring log to next visit (has grandmother's meter)  3) Requesting Trazodone refill, doesn't seen that doctor anymore; #30 with 1RF given   Meds:  Meds ordered this encounter  Medications   traZODone (DESYREL) 100 MG tablet    Sig: Take 1 tablet (100 mg total) by mouth at bedtime as needed for sleep.    Dispense:  30 tablet    Refill:  1    Order Specific Question:   Supervising Provider    Answer:   Myna Hidalgo [[id number]]   Labs/procedures today: POCT Hgb (as part of PN2)  Plan:  Continue routine obstetrical care   Reviewed: Preterm labor symptoms and general obstetric precautions including but not limited to vaginal bleeding, contractions, leaking of fluid and fetal movement were reviewed in detail with the patient.  All questions were answered. Has home bp cuff. Check bp weekly, let us know if >140/90.   Follow-up: Return in about 2 weeks (around 12/18/2022) for LROB, in person.  Orders Placed This Encounter  Procedures   POCT hemoglobin   Arabella Merles [facility] 12/04/2022 10:41 AM

## 2022-12-04 NOTE — Patient Instructions (Signed)
Suni, thank you for choosing our office today! We appreciate the opportunity to meet your healthcare needs. You may receive a short survey by mail, e-mail, or through [employer]. If you are happy with your care we would appreciate if you could take just a few minutes to complete the survey questions. We read all of your comments and take your feedback very seriously. Thank you again for choosing our office.  Center for [employer] Team at [facility]  [facility] at [facility] ([address] [place], [place] [address]) Entrance C, located off of [address] Free 24/7 valet parking   CLASSES: Go to [email or web address] to register for classes (childbirth, breastfeeding, waterbirth, infant CPR, daddy bootcamp, etc.)  Call the office [phone]) or go to [facility] if: You begin to have strong, frequent contractions Your water breaks.  Sometimes it is a big gush of fluid, sometimes it is just a trickle that keeps getting your panties wet or running down your legs You have vaginal bleeding.  It is normal to have a small amount of spotting if your cervix was checked.  You don't feel your baby moving like normal.  If you don't, get you something to eat and drink and lay down and focus on feeling your baby move.   If your baby is still not moving like normal, you should call the office or go to [facility].  Call the office [phone]) or go to [facility] hospital for these signs of pre-eclampsia: Severe headache that does not go away with Tylenol Visual changes- seeing spots, double, blurred vision Pain under your right breast or upper abdomen that does not go away with Tums or heartburn medicine Nausea and/or vomiting Severe swelling in your hands, feet, and face   Tdap Vaccine It is recommended that you get the Tdap vaccine during the third trimester of EACH pregnancy to help protect your baby from getting pertussis (whooping cough) 27-36 weeks is the BEST time to do this  so that you can pass the protection on to your baby. During pregnancy is better than after pregnancy, but if you are unable to get it during pregnancy it will be offered at the hospital.  You can get this vaccine with [place], at the health department, your family doctor, or some local pharmacies Everyone who will be around your baby should also be up-to-date on their vaccines before the baby comes. Adults (who are not pregnant) only need 1 dose of Tdap during adulthood.   [facility] Pediatricians/Family Doctors  [facility]): [address] Colette Ribas, [phone]           [facility]: [address], [phone]                [facility]): [address], [phone] (call to ask if accepting patients) [facility] Department: [address], [place], [phone]    [facility] Pediatricians/Family Doctors [facility]): [phone] [address], [phone] [facility]: [address] [place], [phone] [facility]: [address], [phone]  [facility] Doctors  [facility]): [phone] [facility]: [address], [phone]   [facility] Doctors [facility]: [address], [phone]  [facility] Family Doctors  [facility]: [phone], [phone]  Home Blood Pressure Monitoring for Patients   Your provider has recommended that you check your  blood pressure (BP) at least once a week at home. If you do not have a blood pressure cuff at home, one will be provided for you. Contact your provider if you have not received your monitor within 1 week.   Helpful Tips for Accurate Home Blood Pressure Checks  Don't smoke, exercise, or drink caffeine 30 minutes before checking your BP Use the restroom before checking your BP (a full bladder can raise your  pressure) Relax in a comfortable upright chair Feet on the ground Left arm resting comfortably on a flat surface at the level of your heart Legs uncrossed Back supported Sit quietly and don't talk Place the cuff on your bare arm Adjust snuggly, so that only two fingertips can fit between your skin and the top of the cuff Check 2 readings separated by at least one minute Keep a log of your BP readings For a visual, please reference this diagram: http://ccnc.care/bpdiagram  Provider Name: [facility]     Phone: [phone]  Zone 1: ALL CLEAR  Continue to monitor your symptoms:  BP reading is less than 140 (top number) or less than 90 (bottom number)  No right upper stomach pain No headaches or seeing spots No feeling nauseated or throwing up No swelling in face and hands  Zone 2: CAUTION Call your doctor's office for any of the following:  BP reading is greater than 140 (top number) or greater than 90 (bottom number)  Stomach pain under your ribs in the middle or right side Headaches or seeing spots Feeling nauseated or throwing up Swelling in face and hands  Zone 3: EMERGENCY  Seek immediate medical care if you have any of the following:  BP reading is greater than160 (top number) or greater than 110 (bottom number) Severe headaches not improving with Tylenol Serious difficulty catching your breath Any worsening symptoms from Zone 2   Third Trimester of Pregnancy The third trimester is from week 29 through week 42, months 7 through 9. The third trimester is a time when the fetus is growing rapidly. At the end of the ninth month, the fetus is about 20 inches in length and weighs 6-10 pounds.  BODY CHANGES Your body goes through many changes during pregnancy. The changes vary from woman to woman.  Your weight will continue to increase. You can expect to gain 25-35 pounds (11-16 kg) by the end of the pregnancy. You may begin to get stretch marks on your hips, abdomen,  and breasts. You may urinate more often because the fetus is moving lower into your pelvis and pressing on your bladder. You may develop or continue to have heartburn as a result of your pregnancy. You may develop constipation because certain hormones are causing the muscles that push waste through your intestines to slow down. You may develop hemorrhoids or swollen, bulging veins (varicose veins). You may have pelvic pain because of the weight gain and pregnancy hormones relaxing your joints between the bones in your pelvis. Backaches may result from overexertion of the muscles supporting your posture. You may have changes in your hair. These can include thickening of your hair, rapid growth, and changes in texture. Some women also have hair loss during or after pregnancy, or hair that feels dry or thin. Your hair will most likely return to normal after your baby is born. Your breasts will continue to grow and be tender. A yellow discharge may leak from your breasts called colostrum. Your belly button may stick out. You may  feel short of breath because of your expanding uterus. You may notice the fetus "dropping," or moving lower in your abdomen. You may have a bloody mucus discharge. This usually occurs a few days to a week before labor begins. Your cervix becomes thin and soft (effaced) near your due date. WHAT TO EXPECT AT YOUR PRENATAL EXAMS  You will have prenatal exams every 2 weeks until week 36. Then, you will have weekly prenatal exams. During a routine prenatal visit: You will be weighed to make sure you and the fetus are growing normally. Your blood pressure is taken. Your abdomen will be measured to track your baby's growth. The fetal heartbeat will be listened to. Any test results from the previous visit will be discussed. You may have a cervical check near your due date to see if you have effaced. At around 36 weeks, your caregiver will check your cervix. At the same time, your  caregiver will also perform a test on the secretions of the vaginal tissue. This test is to determine if a type of bacteria, Group B streptococcus, is present. Your caregiver will explain this further. Your caregiver may ask you: What your birth plan is. How you are feeling. If you are feeling the baby move. If you have had any abnormal symptoms, such as leaking fluid, bleeding, severe headaches, or abdominal cramping. If you have any questions. Other tests or screenings that may be performed during your third trimester include: Blood tests that check for low iron levels (anemia). Fetal testing to check the health, activity level, and growth of the fetus. Testing is done if you have certain medical conditions or if there are problems during the pregnancy. FALSE LABOR You may feel small, irregular contractions that eventually go away. These are called Braxton Hicks contractions, or false labor. Contractions may last for hours, days, or even weeks before true labor sets in. If contractions come at regular intervals, intensify, or become painful, it is best to be seen by your caregiver.  SIGNS OF LABOR  Menstrual-like cramps. Contractions that are 5 minutes apart or less. Contractions that start on the top of the uterus and spread down to the lower abdomen and back. A sense of increased pelvic pressure or back pain. A watery or bloody mucus discharge that comes from the vagina. If you have any of these signs before the 37th week of pregnancy, call your caregiver right away. You need to go to the hospital to get checked immediately. HOME CARE INSTRUCTIONS  Avoid all smoking, herbs, alcohol, and unprescribed drugs. These chemicals affect the formation and growth of the baby. Follow your caregiver's instructions regarding medicine use. There are medicines that are either safe or unsafe to take during pregnancy. Exercise only as directed by your caregiver. Experiencing uterine cramps is a good sign to  stop exercising. Continue to eat regular, healthy meals. Wear a good support bra for breast tenderness. Do not use hot tubs, steam rooms, or saunas. Wear your seat belt at all times when driving. Avoid raw meat, uncooked cheese, cat litter boxes, and soil used by cats. These carry germs that can cause birth defects in the baby. Take your prenatal vitamins. Try taking a stool softener (if your caregiver approves) if you develop constipation. Eat more high-fiber foods, such as fresh vegetables or fruit and whole grains. Drink plenty of fluids to keep your urine clear or pale yellow. Take warm sitz baths to soothe any pain or discomfort caused by hemorrhoids. Use hemorrhoid cream if  your caregiver approves. If you develop varicose veins, wear support hose. Elevate your feet for 15 minutes, 3-4 times a day. Limit salt in your diet. Avoid heavy lifting, wear low heal shoes, and practice good posture. Rest a lot with your legs elevated if you have leg cramps or low back pain. Visit your dentist if you have not gone during your pregnancy. Use a soft toothbrush to brush your teeth and be gentle when you floss. A sexual relationship may be continued unless your caregiver directs you otherwise. Do not travel far distances unless it is absolutely necessary and only with the approval of your caregiver. Take prenatal classes to understand, practice, and ask questions about the labor and delivery. Make a trial run to the hospital. Pack your hospital bag. Prepare the baby's nursery. Continue to go to all your prenatal visits as directed by your caregiver. SEEK MEDICAL CARE IF: You are unsure if you are in labor or if your water has broken. You have dizziness. You have mild pelvic cramps, pelvic pressure, or nagging pain in your abdominal area. You have persistent nausea, vomiting, or diarrhea. You have a bad smelling vaginal discharge. You have pain with urination. SEEK IMMEDIATE MEDICAL CARE IF:  You  have a fever. You are leaking fluid from your vagina. You have spotting or bleeding from your vagina. You have severe abdominal cramping or pain. You have rapid weight loss or gain. You have shortness of breath with chest pain. You notice sudden or extreme swelling of your face, hands, ankles, feet, or legs. You have not felt your baby move in over an hour. You have severe headaches that do not go away with medicine. You have vision changes. Document Released: 06/18/2001 Document Revised: 06/29/2013 Document Reviewed: 08/25/2012 [facility] Patient Information 2015 [place], [place]. This information is not intended to replace advice given to you by your health care provider. Make sure you discuss any questions you have with your health care provider.

## 2022-12-19 ENCOUNTER — Ambulatory Visit (INDEPENDENT_AMBULATORY_CARE_PROVIDER_SITE_OTHER): Payer: Medicaid Other | Admitting: Advanced Practice Midwife

## 2022-12-19 ENCOUNTER — Encounter: Payer: Self-pay | Admitting: Advanced Practice Midwife

## 2022-12-19 VITALS — BP 103/70 | HR 97 | Wt 218.4 lb

## 2022-12-19 DIAGNOSIS — Z3480 Encounter for supervision of other normal pregnancy, unspecified trimester: Secondary | ICD-10-CM

## 2022-12-19 DIAGNOSIS — Z3483 Encounter for supervision of other normal pregnancy, third trimester: Secondary | ICD-10-CM

## 2022-12-19 DIAGNOSIS — Z3A30 30 weeks gestation of pregnancy: Secondary | ICD-10-CM

## 2022-12-19 NOTE — Progress Notes (Signed)
   LOW-RISK PREGNANCY VISIT Patient name: Linda Jackson MRN [id number]  Date of birth: 1991-05-03 Chief Complaint:   Routine Prenatal Visit  History of Present Illness:   Linda Jackson is a 32 y.o. G32P3003 female at [redacted]w[redacted]d with an Estimated Date of Delivery: 02/26/23 being seen today for ongoing management of a low-risk pregnancy.  Today she reports . Took QID BS for the past few days had one FBS of 95--needs to collect more data. Contractions: Irregular.  .  Movement: Present. denies leaking of fluid. Review of Systems:   Pertinent items are noted in HPI Denies abnormal vaginal discharge w/ itching/odor/irritation, headaches, visual changes, shortness of breath, chest pain, abdominal pain, severe nausea/vomiting, or problems with urination or bowel movements unless otherwise stated above. Pertinent History Reviewed:  Reviewed past medical,surgical, social, obstetrical and family history.  Reviewed problem list, medications and allergies. Physical Assessment:   Vitals:   12/19/22 1602  BP: 103/70  Pulse: 97  Weight: 99.1 kg  Body mass index is 41.27 kg/m.        Physical Examination:   General appearance: Well appearing, and in no distress  Mental status: Alert, oriented to person, place, and time  Skin: Warm & dry  Cardiovascular: Normal heart rate noted  Respiratory: Normal respiratory effort, no distress  Abdomen: Soft, gravid, nontender  Pelvic: Cervical exam deferred         Extremities: Edema: Trace  Fetal Status:     Movement: Present    Chaperone:  N/A    No results found for this or any previous visit (from the past 24 hour(s)).  Assessment & Plan:    Pregnancy: G4P3003 at 109w1d 1. Supervision of other normal pregnancy, antepartum   2. [redacted] weeks gestation of pregnancy--continue BS checking  FBS and at least one pp/day      Meds: No orders of the defined types were placed in this encounter.  Labs/procedures today: none  Plan:  Continue routine  obstetrical care  Next visit: prefers in person    Reviewed: Preterm labor symptoms and general obstetric precautions including but not limited to vaginal bleeding, contractions, leaking of fluid and fetal movement were reviewed in detail with the patient.  All questions were answered. Has home bp cuff.. Check bp weekly, let us know if >140/90.   Follow-up: Return in about 2 weeks (around 01/02/2023) for LROB.  Future Appointments  Date Time Provider Department Center  01/02/2023 10:10 AM Jacklyn Shell, CNM [facility]  01/16/2023 11:50 AM Cresenzo-Dishmon, Scarlette Calico, CNM [facility]    No orders of the defined types were placed in this encounter.  Jacklyn Shell DNP, CNM 12/27/2022 1:22 AM

## 2022-12-19 NOTE — Patient Instructions (Signed)
[name], I greatly value your feedback.  If you receive a survey following your visit with [place] today, we appreciate you taking the time to fill it out.  Thanks, Cathie Beams, DNP, CNM  [facility] HAS MOVED!!! It is now [facility] at [facility] ([address] [place], [place] [address]) Entrance located off of [address] Free 24/7 valet parking   Go to [email or web address] to register for FREE online childbirth classes    Call the office [phone]) or go to [facility] if: You begin to have strong, frequent contractions Your water breaks.  Sometimes it is a big gush of fluid, sometimes it is just a trickle that keeps getting your panties wet or running down your legs You have vaginal bleeding.  It is normal to have a small amount of spotting if your cervix was checked.  You don't feel your baby moving like normal.  If you don't, get you something to eat and drink and lay down and focus on feeling your baby move.  You should feel at least 10 movements in 2 hours.  If you don't, you should call the office or go to [facility].   Home Blood Pressure Monitoring for Patients   Your provider has recommended that you check your blood pressure (BP) at least once a week at home. If you do not have a blood pressure cuff at home, one will be provided for you. Contact your provider if you have not received your monitor within 1 week.   Helpful Tips for Accurate Home Blood Pressure Checks  Don't smoke, exercise, or drink caffeine 30 minutes before checking your BP Use the restroom before checking your BP (a full bladder can raise your pressure) Relax in a comfortable upright chair Feet on the ground Left arm resting comfortably on a flat surface at the level of your heart Legs uncrossed Back supported Sit quietly and don't talk Place the cuff on your bare arm Adjust snuggly, so that only two fingertips can fit between your skin and the top  of the cuff Check 2 readings separated by at least one minute Keep a log of your BP readings For a visual, please reference this diagram: http://ccnc.care/bpdiagram  Provider Name: [facility]     Phone: [phone]  Zone 1: ALL CLEAR  Continue to monitor your symptoms:  BP reading is less than 140 (top number) or less than 90 (bottom number)  No right upper stomach pain No headaches or seeing spots No feeling nauseated or throwing up No swelling in face and hands  Zone 2: CAUTION Call your doctor's office for any of the following:  BP reading is greater than 140 (top number) or greater than 90 (bottom number)  Stomach pain under your ribs in the middle or right side Headaches or seeing spots Feeling nauseated or throwing up Swelling in face and hands  Zone 3: EMERGENCY  Seek immediate medical care if you have any of the following:  BP reading is greater than160 (top number) or greater than 110 (bottom number) Severe headaches not improving with Tylenol Serious difficulty catching your breath Any worsening symptoms from Zone 2

## 2023-01-02 ENCOUNTER — Encounter: Payer: Self-pay | Admitting: Advanced Practice Midwife

## 2023-01-02 ENCOUNTER — Ambulatory Visit (INDEPENDENT_AMBULATORY_CARE_PROVIDER_SITE_OTHER): Payer: Medicaid Other | Admitting: Advanced Practice Midwife

## 2023-01-02 VITALS — BP 105/72 | HR 96 | Wt 219.0 lb

## 2023-01-02 DIAGNOSIS — Z23 Encounter for immunization: Secondary | ICD-10-CM

## 2023-01-02 DIAGNOSIS — F332 Major depressive disorder, recurrent severe without psychotic features: Secondary | ICD-10-CM

## 2023-01-02 DIAGNOSIS — Z3A32 32 weeks gestation of pregnancy: Secondary | ICD-10-CM

## 2023-01-02 DIAGNOSIS — O26843 Uterine size-date discrepancy, third trimester: Secondary | ICD-10-CM

## 2023-01-02 DIAGNOSIS — Z3480 Encounter for supervision of other normal pregnancy, unspecified trimester: Secondary | ICD-10-CM

## 2023-01-02 NOTE — Patient Instructions (Signed)
Linda Jackson, I greatly value your feedback.  If you receive a survey following your visit with us today, we appreciate you taking the time to fill it out.  Thanks, Linda Cresenzo-Dishmon, DNP, CNM  [facility] It is now [facility] ([address] [place], [place] [address]) Entrance located off of [address] Free 24/7 valet parking   Go to [email or web address] to register for FREE online childbirth classes    Call the office [phone]) or go to [facility] if: You begin to have strong, frequent contractions Your water breaks.  Sometimes it is a big gush of fluid, sometimes it is just a trickle that keeps getting your panties wet or running down your legs You have vaginal bleeding.  It is normal to have a small amount of spotting if your cervix was checked.  You don't feel your baby moving like normal.  If you don't, get you something to eat and drink and lay down and focus on feeling your baby move.  You should feel at least 10 movements in 2 hours.  If you don't, you should call the office or go to [facility].   Home Blood Pressure Monitoring for Patients   Your provider has recommended that you check your blood pressure (BP) at least once a week at home. If you do not have a blood pressure cuff at home, one will be provided for you. Contact your provider if you have not received your monitor within 1 week.   Helpful Tips for Accurate Home Blood Pressure Checks  Don't smoke, exercise, or drink caffeine 30 minutes before checking your BP Use the restroom before checking your BP (a full bladder can raise your pressure) Relax in a comfortable upright chair Feet on the ground Left arm resting comfortably on a flat surface at the level of your heart Legs uncrossed Back supported Sit quietly and don't talk Place the cuff on your bare arm Adjust snuggly, so that only two fingertips can fit between your skin and the top  of the cuff Check 2 readings separated by at least one minute Keep a log of your BP readings For a visual, please reference this diagram: http://ccnc.care/bpdiagram  Provider Name: [facility]     Phone: [phone]  Zone 1: ALL CLEAR  Continue to monitor your symptoms:  BP reading is less than 140 (top number) or less than 90 (bottom number)  No right upper stomach pain No headaches or seeing spots No feeling nauseated or throwing up No swelling in face and hands  Zone 2: CAUTION Call your doctor's office for any of the following:  BP reading is greater than 140 (top number) or greater than 90 (bottom number)  Stomach pain under your ribs in the middle or right side Headaches or seeing spots Feeling nauseated or throwing up Swelling in face and hands  Zone 3: EMERGENCY  Seek immediate medical care if you have any of the following:  BP reading is greater than160 (top number) or greater than 110 (bottom number) Severe headaches not improving with Tylenol Serious difficulty catching your breath Any worsening symptoms from Zone 2   

## 2023-01-02 NOTE — Progress Notes (Addendum)
   LOW-RISK PREGNANCY VISIT Patient name: Linda Jackson MRN [id number]  Date of birth: 1991-02-11 Chief Complaint:   Routine Prenatal Visit  History of Present Illness:   Linda Jackson is a 32 y.o. G56P3003 female at [redacted]w[redacted]d with an Estimated Date of Delivery: 02/26/23 being seen today for ongoing management of a low-risk pregnancy.  Today she reports normal pregnancy complaints.  Forgot to bring BS LOG.  Has ext hemmoroids. Contractions: Not present.  .  Movement: Present. denies leaking of fluid. Review of Systems:   Pertinent items are noted in HPI Denies abnormal vaginal discharge w/ itching/odor/irritation, headaches, visual changes, shortness of breath, chest pain, abdominal pain, severe nausea/vomiting, or problems with urination or bowel movements unless otherwise stated above. Pertinent History Reviewed:  Reviewed past medical,surgical, social, obstetrical and family history.  Reviewed problem list, medications and allergies. Physical Assessment:   Vitals:   01/02/23 1021  BP: 105/72  Pulse: 96  Weight: 219 lb (99.3 kg)  Body mass index is 41.38 kg/m.        Physical Examination:   General appearance: Well appearing, and in no distress  Mental status: Alert, oriented to person, place, and time  Skin: Warm & dry  Cardiovascular: Normal heart rate noted  Respiratory: Normal respiratory effort, no distress  Abdomen: Soft, gravid, nontender  Pelvic: Cervical exam deferred         Extremities:    Fetal Status: Fetal Heart Rate (bpm): 131 Fundal Height: 35 cm Movement: Present    Chaperone:  N/A    No results found for this or any previous visit (from the past 24 hour(s)).  Assessment & Plan:    Pregnancy: G4P3003 at [redacted]w[redacted]d 1. [redacted] weeks gestation of pregnancy   2. Supervision of other normal pregnancy, antepartum   3. 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  Size >dates:  check EFW and AFI     Meds: No orders  of the defined types were placed in this encounter.  Labs/procedures today: tdap  Plan:  Continue routine obstetrical care  Next visit: prefers in person    Reviewed: Preterm labor symptoms and general obstetric precautions including but not limited to vaginal bleeding, contractions, leaking of fluid and fetal movement were reviewed in detail with the patient.  All questions were answered. Has home bp cuff. . Check bp weekly, let us know if >140/90.   Follow-up: Return for [place] for EFW/AFI ASAP.  Future Appointments  Date Time Provider Department Center  01/15/2023  1:15 PM [facility]  01/16/2023 11:50 AM Lazaro Arms, MD [facility]  01/30/2023 10:30 AM Jacklyn Shell, CNM [facility]  02/06/2023 11:30 AM Jacklyn Shell, CNM [facility]  02/12/2023 10:50 AM Arabella Merles, CNM [facility]  02/19/2023 11:10 AM Arabella Merles, CNM [facility]  02/26/2023 11:10 AM Arabella Merles, CNM [facility]    Orders Placed This Encounter  Procedures   US OB Follow Up   Tdap vaccine greater than or equal to 7yo IM   Jacklyn Shell DNP, CNM 01/02/2023 3:14 PM

## 2023-01-09 ENCOUNTER — Inpatient Hospital Stay (HOSPITAL_COMMUNITY)
Admission: AD | Admit: 2023-01-09 | Discharge: 2023-01-10 | Disposition: A | Discharge status: Home / Self Care | Payer: MEDICAID | Attending: Obstetrics and Gynecology | Admitting: Obstetrics and Gynecology

## 2023-01-09 ENCOUNTER — Encounter (HOSPITAL_COMMUNITY): Payer: Self-pay | Admitting: Obstetrics and Gynecology

## 2023-01-09 DIAGNOSIS — Z3A33 33 weeks gestation of pregnancy: Secondary | ICD-10-CM | POA: Diagnosis not present

## 2023-01-09 DIAGNOSIS — F17210 Nicotine dependence, cigarettes, uncomplicated: Secondary | ICD-10-CM | POA: Insufficient documentation

## 2023-01-09 DIAGNOSIS — O4703 False labor before 37 completed weeks of gestation, third trimester: Secondary | ICD-10-CM

## 2023-01-09 DIAGNOSIS — R8789 Other abnormal findings in specimens from female genital organs: Secondary | ICD-10-CM

## 2023-01-09 DIAGNOSIS — O26893 Other specified pregnancy related conditions, third trimester: Secondary | ICD-10-CM | POA: Diagnosis present

## 2023-01-09 DIAGNOSIS — Z3689 Encounter for other specified antenatal screening: Secondary | ICD-10-CM

## 2023-01-09 DIAGNOSIS — O99353 Diseases of the nervous system complicating pregnancy, third trimester: Secondary | ICD-10-CM | POA: Insufficient documentation

## 2023-01-09 DIAGNOSIS — R519 Headache, unspecified: Secondary | ICD-10-CM | POA: Diagnosis not present

## 2023-01-09 DIAGNOSIS — N760 Acute vaginitis: Secondary | ICD-10-CM

## 2023-01-09 DIAGNOSIS — O99333 Smoking (tobacco) complicating pregnancy, third trimester: Secondary | ICD-10-CM | POA: Insufficient documentation

## 2023-01-09 LAB — WET PREP, GENITAL
Sperm: NONE SEEN
Trich, Wet Prep: NONE SEEN
WBC, Wet Prep HPF POC: <10 (ref <10)
Yeast Wet Prep HPF POC: NONE SEEN

## 2023-01-09 LAB — URINALYSIS, ROUTINE W REFLEX MICROSCOPIC
Bilirubin Urine: NEGATIVE
Glucose, UA: NEGATIVE mg/dL
Hgb urine dipstick: NEGATIVE
Ketones, ur: NEGATIVE mg/dL
Leukocytes,Ua: NEGATIVE
Nitrite: NEGATIVE
Protein, ur: NEGATIVE mg/dL
Specific Gravity, Urine: 1.006 (ref 1.005–1.030)
pH: 7 (ref 5.0–8.0)

## 2023-01-09 MED ORDER — ACETAMINOPHEN-CAFFEINE 500-65 MG PO TABS
2.0000 | ORAL_TABLET | Freq: Once | ORAL | Status: DC
  Filled 2023-01-09: qty 2

## 2023-01-09 MED ORDER — NIFEDIPINE 10 MG PO CAPS
10.0000 mg | ORAL_CAPSULE | ORAL | Status: AC | PRN
  Administered 2023-01-09 – 2023-01-10 (×4): 10 mg via ORAL
  Filled 2023-01-09 (×4): qty 1

## 2023-01-09 NOTE — MAU Note (Signed)
.  Linda Jackson is a 32 y.o. at [redacted]w[redacted]d here in MAU reporting pain in lower back and abdomen for an hour and half. Reports feeling FM but has been less today than normal. Denies VB or LOF.   Onset of complaint: 2030 Pain score: 5 Vitals:   01/09/23 2210 01/09/23 2212  BP:  115/78  Pulse: 96   Resp: 18   Temp: 98 F (36.7 C)   SpO2: 97%      FHT:140 Lab orders placed from triage:  u/a

## 2023-01-09 NOTE — MAU Provider Note (Signed)
History     CSN: [id number]  Arrival date and time: 01/09/23 2158   Event Date/Time   First Provider Initiated Contact with Patient 01/09/23 2302      Chief Complaint  Patient presents with   Abdominal Pain   Back Pain   Linda Jackson is a 32 y.o. G4P3003 at [redacted]w[redacted]d who receives care at [facility].  She presents today for abdominal and back pain.  She states fetal movement is also "sluggish" today.  She reports drinking water, sugary drink, and eating with some improvement initially.  She does report good movement now. She reports some intermittent sharp abdominal pain that started about 2 hours ago with pressure in her bottom and back.  She denies worsening or relieving factors for the pain. She rates the pain a 5-6/10 and states it "makes me feel really really uncomfortable."   OB History     Gravida  4   Para  3   Term  3   Preterm      AB      Living  3      SAB      IAB      Ectopic      Multiple  0   Live Births  3           Past Medical History:  Diagnosis Date   Bipolar 1 disorder (HCC)    Chronic back pain    Depression     Past Surgical History:  Procedure Laterality Date   CHOLECYSTECTOMY     FEMUR IM NAIL Left 05/25/2020   Procedure: INTRAMEDULLARY (IM) NAIL FEMORAL;  Surgeon: Roby Lofts, MD;  Location: [facility] OR;  Service: Orthopedics;  Laterality: Left;   TONSILLECTOMY     TYMPANOSTOMY TUBE PLACEMENT  1997    Family History  Problem Relation Age of Onset   Depression Mother    Kidney disease Mother    Stroke Mother    ADD / ADHD Brother    COPD Maternal Grandmother    Cancer Maternal Grandfather        brain   Diabetes Paternal Grandmother    Breast cancer Paternal Grandmother    Diabetes Paternal Grandfather    Hypertension Paternal Grandfather    Parkinson's disease Paternal Grandfather     Social History   Tobacco Use   Smoking status: Every Day    Packs/day: .25    Types: Cigarettes   Smokeless tobacco: Never   Vaping Use   Vaping Use: Some days  Substance Use Topics   Alcohol use: No   Drug use: Not Currently    Types: Marijuana, "Crack" cocaine, Cocaine, Methamphetamines    Allergies:  Allergies  Allergen Reactions   Morphine And Codeine Itching    "tried to claw my eyes out"   Cefzil [Cefprozil] Hives   Lexapro [Escitalopram Oxalate] Nausea And Vomiting   Red Dye     Medications Prior to Admission  Medication Sig Dispense Refill Last Dose   acetaminophen (TYLENOL) 500 MG tablet Take 500 mg by mouth every 6 (six) hours as needed for headache. Pt reports taking 2 tablets today for HA   01/09/2023   aspirin EC 81 MG tablet Take 1 tablet (81 mg total) by mouth daily. Swallow whole. 90 tablet 3 01/08/2023   Prenatal Vit-Fe Fumarate-FA (PRENATAL VITAMIN PO) Take by mouth.   01/08/2023   traZODone (DESYREL) 100 MG tablet Take 1 tablet (100 mg total) by mouth at bedtime as needed for sleep.  30 tablet 1     Review of Systems  Eyes:  Negative for visual disturbance.  Gastrointestinal:  Positive for abdominal pain, diarrhea (Loose x 1 today) and nausea (None currently). Negative for constipation and vomiting.  Genitourinary:  Positive for vaginal discharge (White Mucous). Negative for difficulty urinating, dysuria and vaginal bleeding.  Musculoskeletal:  Positive for back pain.  Neurological:  Positive for headaches (Frontal, feels like "dull ache" took tylenol (1500) and now "annoyance" 2-3/10.). Negative for dizziness and light-headedness.   Physical Exam   Blood pressure 116/73, pulse (!) 104, temperature 98 F (36.7 C), resp. rate 17, height 5\' 1"  (1.549 m), weight 101.6 kg, last menstrual period 05/22/2022, SpO2 97 %.  Physical Exam Vitals reviewed. Exam conducted with a chaperone present.  Constitutional:      Appearance: She is well-developed. She is obese. She is not ill-appearing.  HENT:     Head: Normocephalic and atraumatic.  Eyes:     Conjunctiva/sclera: Conjunctivae normal.   Cardiovascular:     Rate and Rhythm: Normal rate.  Pulmonary:     Effort: Pulmonary effort is normal. No respiratory distress.  Abdominal:     Palpations: Abdomen is soft.  Genitourinary:    Comments: Speculum Exam: -Normal External Genitalia: Non tender, no apparent discharge at introitus.  -Vaginal Vault: Pink mucosa with good rugae. Moderate amt frothy white discharge -fFN and wet prep collected -Cervix:Pink, no lesions, cysts, or polyps.  Appears closed. No active bleeding from os-GC/CT collected -Bimanual Exam: Dilation: 1 Effacement (%): Thick Cervical Position: Posterior Station: Ballotable Exam by:: J.Yanni Quiroa, CNM Musculoskeletal:        General: Normal range of motion.  Skin:    General: Skin is warm and dry.  Neurological:     Mental Status: She is alert and oriented to person, place, and time.     Fetal Assessment 135 bpm, Mod Var, -Decels, +15x15 Accels Toco: Q2-104min  MAU Course   Results for orders placed or performed during the hospital encounter of 01/09/23 (from the past 24 hour(s))  Urinalysis, Routine w reflex microscopic -Urine, Clean Catch     Status: None   Collection Time: 01/09/23 10:20 PM  Result Value Ref Range   Color, Urine YELLOW YELLOW   APPearance CLEAR CLEAR   Specific Gravity, Urine 1.006 1.005 - 1.030   pH 7.0 5.0 - 8.0   Glucose, UA NEGATIVE NEGATIVE mg/dL   Hgb urine dipstick NEGATIVE NEGATIVE   Bilirubin Urine NEGATIVE NEGATIVE   Ketones, ur NEGATIVE NEGATIVE mg/dL   Protein, ur NEGATIVE NEGATIVE mg/dL   Nitrite NEGATIVE NEGATIVE   Leukocytes,Ua NEGATIVE NEGATIVE  Fetal fibronectin     Status: Abnormal   Collection Time: 01/09/23 11:27 PM  Result Value Ref Range   Fetal Fibronectin POSITIVE (A) NEGATIVE  Wet prep, genital     Status: Abnormal   Collection Time: 01/09/23 11:28 PM  Result Value Ref Range   Yeast Wet Prep HPF POC NONE SEEN NONE SEEN   Trich, Wet Prep NONE SEEN NONE SEEN   Clue Cells Wet Prep HPF POC PRESENT (A)  NONE SEEN   WBC, Wet Prep HPF POC <10 <10   Sperm NONE SEEN    No results found.  MDM PE Labs:UA, fFN, Wet Prep, GC/CT EFM Pain Medication Procardia Series Betamethasone Assessment and Plan  32 year old G4P3003  SIUP at 33.1 weeks Cat I FT Preterm Contractions Headache  -POC Reviewed -Discussed collecting cultures and fFN. -Patient offered and accepts pain medication.  -Give  excedrin tension for HA.  -Will also give procardia series for contractions. -Patient expresses concern with having to work tomorrow.  -Discussed OOW tomorrow and note to be given.  -Exam performed and findings discussed. -NST Reactive. -Monitor and reassess.  -Pharmacy calls and requests Excedrin tension HA order changed to tylenol and caffeine d/t patient red dye allergy.  Order placed.   Cherre Robins MSN, CNM 01/09/2023, 11:02 PM   Reassessment (12:28 AM) -fFN returns positive -Provider to bedside to discuss. -Recommendation for BMZ dosing x 2 and patient agreeable. -Further discussed modified bed rest x next 2 weeks. Will give note to be OOW. -Cautioned that primary office may modify for shorter time or extend as they deem appropriate.  -Contractions remain. Discussed next steps after completion of procardia series. -Discussed  -Patient questions addressed. -Dr. Jaymes Graff updated on patient status and interventions. No new orders/recommendations.   Reassessment (2:59 AM)  -Patient s/p 4th procardia and reports abdominal pain has improved (2/10).  -No ctx graphed. -Cervical exam performed and remains unchanged.  -Discussed returning on Saturday for repeat BMZ at 0800. -Precautions reviewed. -Patient without questions. -Given OOW note for return on July 22nd.  -Encouraged to call primary office or return to MAU if symptoms worsen or with the onset of new symptoms. -Discharged to home in stable condition.  Cherre Robins MSN, CNM Advanced Practice Provider, [facility] [employer]

## 2023-01-10 DIAGNOSIS — O4703 False labor before 37 completed weeks of gestation, third trimester: Secondary | ICD-10-CM

## 2023-01-10 DIAGNOSIS — Z3A33 33 weeks gestation of pregnancy: Secondary | ICD-10-CM

## 2023-01-10 LAB — FETAL FIBRONECTIN: Fetal Fibronectin: POSITIVE — AB

## 2023-01-10 LAB — GC/CHLAMYDIA PROBE AMP (~~LOC~~) NOT AT ARMC
Chlamydia: NEGATIVE
Comment: NEGATIVE
Comment: NORMAL
Neisseria Gonorrhea: NEGATIVE

## 2023-01-10 MED ORDER — ACETAMINOPHEN 500 MG PO TABS
1000.0000 mg | ORAL_TABLET | Freq: Once | ORAL | Status: AC
  Administered 2023-01-10: 1000 mg via ORAL
  Filled 2023-01-10: qty 2

## 2023-01-10 MED ORDER — BETAMETHASONE SOD PHOS & ACET 6 (3-3) MG/ML IJ SUSP
12.0000 mg | Freq: Once | INTRAMUSCULAR | Status: AC
  Administered 2023-01-10: 12 mg via INTRAMUSCULAR
  Filled 2023-01-10: qty 5

## 2023-01-10 MED ORDER — METRONIDAZOLE 500 MG PO TABS: 500.0000 mg | ORAL_TABLET | Freq: Two times a day (BID) | ORAL | 0 refills | Status: DC

## 2023-01-10 MED ORDER — CAFFEINE 200 MG PO TABS
200.0000 mg | ORAL_TABLET | Freq: Once | ORAL | Status: AC
  Administered 2023-01-10: 200 mg via ORAL
  Filled 2023-01-10: qty 1

## 2023-01-11 ENCOUNTER — Inpatient Hospital Stay (HOSPITAL_COMMUNITY)
Admission: AD | Admit: 2023-01-11 | Discharge: 2023-01-11 | Disposition: A | Discharge status: Home / Self Care | Payer: MEDICAID | Attending: Obstetrics & Gynecology | Admitting: Obstetrics & Gynecology

## 2023-01-11 DIAGNOSIS — Z3A33 33 weeks gestation of pregnancy: Secondary | ICD-10-CM | POA: Insufficient documentation

## 2023-01-11 DIAGNOSIS — O4703 False labor before 37 completed weeks of gestation, third trimester: Secondary | ICD-10-CM | POA: Diagnosis present

## 2023-01-11 MED ORDER — BETAMETHASONE SOD PHOS & ACET 6 (3-3) MG/ML IJ SUSP
12.0000 mg | Freq: Once | INTRAMUSCULAR | Status: AC
  Administered 2023-01-11: 12 mg via INTRAMUSCULAR
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
## 2023-01-11 NOTE — MAU Provider Note (Signed)
Event Date/Time   First Provider Initiated Contact with Patient 01/11/23 1115      S Ms. Linda Jackson is a 32 y.o. [phone] patient who presents to MAU today without complaints. Here for her 2nd BMZ injection.   O BP 113/71 (BP Location: Right Arm)   Pulse (!) 110   Temp 98 F (36.7 C) (Oral)   Resp 16   Ht 5\' 1"  (1.549 m)   Wt 100.5 kg   LMP 05/22/2022   SpO2 99%   BMI 41.87 kg/m  Physical Exam Vitals and nursing note reviewed.  Constitutional:      General: She is not in acute distress.    Appearance: Normal appearance.  HENT:     Head: Normocephalic.  Pulmonary:     Effort: Pulmonary effort is normal.  Musculoskeletal:     Cervical back: Normal range of motion.  Skin:    General: Skin is warm and dry.  Neurological:     Mental Status: She is alert and oriented to person, place, and time.  Psychiatric:        Mood and Affect: Mood normal.     A Medical screening exam complete - 2nd dose of BMZ administered   P Discharge from MAU in stable condition Warning signs for worsening condition that would warrant emergency follow-up discussed Patient may return to MAU as needed   Carlynn Herald, CNM 01/11/2023 11:15 AM

## 2023-01-15 ENCOUNTER — Ambulatory Visit (INDEPENDENT_AMBULATORY_CARE_PROVIDER_SITE_OTHER): Payer: MEDICAID

## 2023-01-15 DIAGNOSIS — Z3A32 32 weeks gestation of pregnancy: Secondary | ICD-10-CM | POA: Diagnosis not present

## 2023-01-15 DIAGNOSIS — O26843 Uterine size-date discrepancy, third trimester: Secondary | ICD-10-CM

## 2023-01-16 ENCOUNTER — Encounter: Payer: Self-pay | Admitting: Obstetrics & Gynecology

## 2023-01-16 ENCOUNTER — Ambulatory Visit (INDEPENDENT_AMBULATORY_CARE_PROVIDER_SITE_OTHER): Payer: MEDICAID | Admitting: Obstetrics & Gynecology

## 2023-01-16 VITALS — BP 128/82 | HR 111

## 2023-01-16 DIAGNOSIS — Z3480 Encounter for supervision of other normal pregnancy, unspecified trimester: Secondary | ICD-10-CM

## 2023-01-16 DIAGNOSIS — O4703 False labor before 37 completed weeks of gestation, third trimester: Secondary | ICD-10-CM

## 2023-01-16 DIAGNOSIS — Z3A34 34 weeks gestation of pregnancy: Secondary | ICD-10-CM

## 2023-01-16 MED ORDER — NIFEDIPINE 10 MG PO CAPS: 10.0000 mg | ORAL_CAPSULE | Freq: Four times a day (QID) | ORAL | 1 refills | Status: DC | PRN

## 2023-01-16 NOTE — Progress Notes (Signed)
   LOW-RISK PREGNANCY VISIT Patient name: Linda Jackson MRN [id number]  Date of birth: April 09, 1991 Chief Complaint:   Routine Prenatal Visit (Contractions every 20 minutes starting in back radiating to lower abdomen)  History of Present Illness:   Linda Jackson is a 32 y.o. [phone] female at [redacted]w[redacted]d with an Estimated Date of Delivery: 02/26/23 being seen today for ongoing management of a low-risk pregnancy.     12/04/2022    9:56 AM 08/19/2022    2:27 PM 07/04/2022    9:20 AM  Depression screen PHQ 2/9  Decreased Interest 1 0 0  Down, Depressed, Hopeless 2 0 1  PHQ - 2 Score 3 0 1  Altered sleeping 1 2 2   Tired, decreased energy 2 1 2   Change in appetite 0 1 0  Feeling bad or failure about yourself  2 0 0  Trouble concentrating 2 0 0  Moving slowly or fidgety/restless 0 0 0  Suicidal thoughts 0 0 0  PHQ-9 Score 10 4 5   Difficult doing work/chores Somewhat difficult      Today she reports no complaints. Contractions: Irregular. Vag. Bleeding: None.  Movement: Present. denies leaking of fluid. Review of Systems:   Pertinent items are noted in HPI Denies abnormal vaginal discharge w/ itching/odor/irritation, headaches, visual changes, shortness of breath, chest pain, abdominal pain, severe nausea/vomiting, or problems with urination or bowel movements unless otherwise stated above. Pertinent History Reviewed:  Reviewed past medical,surgical, social, obstetrical and family history.  Reviewed problem list, medications and allergies. Physical Assessment:   Vitals:   01/16/23 1136  BP: 128/82  Pulse: (!) 111  There is no height or weight on file to calculate BMI.        Physical Examination:   General appearance: Well appearing, and in no distress  Mental status: Alert, oriented to person, place, and time  Skin: Warm & dry  Cardiovascular: Normal heart rate noted  Respiratory: Normal respiratory effort, no distress  Abdomen: Soft, gravid, nontender  Pelvic: Cervical exam  performed  Dilation: 1 Effacement (%): Thick Station: Ballotable  Extremities: Edema: None  Fetal Status: Fetal Heart Rate (bpm): 142 Fundal Height: 36 cm Movement: Present Presentation: Vertex  Chaperone: Latisha Cresenzo    No results found for this or any previous visit (from the past 24 hour(s)).  Assessment & Plan:  1) Low-risk pregnancy G4P3003 at [redacted]w[redacted]d with an Estimated Date of Delivery: 02/26/23   2) Threatened preterm labor, stable cervical exam over the past week, has received betamethasone x 2, I prescribed prn Procardia 10 in case she has increased discomfort   Meds:  Meds ordered this encounter  Medications   NIFEdipine (PROCARDIA) 10 MG capsule    Sig: Take 1 capsule (10 mg total) by mouth every 6 (six) hours as needed (increased uterine activity).    Dispense:  20 capsule    Refill:  1   Labs/procedures today:   Plan:  Continue routine obstetrical care  Next visit: prefers in person    Reviewed: Preterm labor symptoms and general obstetric precautions including but not limited to vaginal bleeding, contractions, leaking of fluid and fetal movement were reviewed in detail with the patient.  All questions were answered. Has home bp cuff. Rx faxed to . Check bp weekly, let us know if >140/90.   Follow-up: Return in about 2 weeks (around 01/30/2023) for LROB.  No orders of the defined types were placed in this encounter.   Lazaro Arms, MD 01/16/2023 11:48 AM

## 2023-01-22 ENCOUNTER — Other Ambulatory Visit: Payer: Self-pay

## 2023-01-22 ENCOUNTER — Telehealth: Payer: Self-pay | Admitting: *Deleted

## 2023-01-22 ENCOUNTER — Inpatient Hospital Stay (HOSPITAL_COMMUNITY)
Admission: AD | Admit: 2023-01-22 | Discharge: 2023-01-22 | Disposition: A | Discharge status: Home / Self Care | Payer: MEDICAID | Attending: Obstetrics and Gynecology | Admitting: Obstetrics and Gynecology

## 2023-01-22 ENCOUNTER — Encounter (HOSPITAL_COMMUNITY): Payer: Self-pay | Admitting: Obstetrics and Gynecology

## 2023-01-22 DIAGNOSIS — O99323 Drug use complicating pregnancy, third trimester: Secondary | ICD-10-CM | POA: Diagnosis not present

## 2023-01-22 DIAGNOSIS — N898 Other specified noninflammatory disorders of vagina: Secondary | ICD-10-CM

## 2023-01-22 DIAGNOSIS — O99353 Diseases of the nervous system complicating pregnancy, third trimester: Secondary | ICD-10-CM | POA: Insufficient documentation

## 2023-01-22 DIAGNOSIS — M545 Low back pain, unspecified: Secondary | ICD-10-CM | POA: Insufficient documentation

## 2023-01-22 DIAGNOSIS — Z3A35 35 weeks gestation of pregnancy: Secondary | ICD-10-CM | POA: Diagnosis not present

## 2023-01-22 DIAGNOSIS — O26893 Other specified pregnancy related conditions, third trimester: Secondary | ICD-10-CM

## 2023-01-22 DIAGNOSIS — O99333 Smoking (tobacco) complicating pregnancy, third trimester: Secondary | ICD-10-CM | POA: Diagnosis not present

## 2023-01-22 DIAGNOSIS — O09213 Supervision of pregnancy with history of pre-term labor, third trimester: Secondary | ICD-10-CM | POA: Insufficient documentation

## 2023-01-22 DIAGNOSIS — O99343 Other mental disorders complicating pregnancy, third trimester: Secondary | ICD-10-CM | POA: Insufficient documentation

## 2023-01-22 DIAGNOSIS — Z79899 Other long term (current) drug therapy: Secondary | ICD-10-CM | POA: Diagnosis not present

## 2023-01-22 DIAGNOSIS — Z7982 Long term (current) use of aspirin: Secondary | ICD-10-CM | POA: Insufficient documentation

## 2023-01-22 DIAGNOSIS — F17210 Nicotine dependence, cigarettes, uncomplicated: Secondary | ICD-10-CM | POA: Diagnosis not present

## 2023-01-22 DIAGNOSIS — G8929 Other chronic pain: Secondary | ICD-10-CM | POA: Insufficient documentation

## 2023-01-22 LAB — URINALYSIS, ROUTINE W REFLEX MICROSCOPIC
Bilirubin Urine: NEGATIVE
Glucose, UA: NEGATIVE mg/dL
Hgb urine dipstick: NEGATIVE
Ketones, ur: NEGATIVE mg/dL
Leukocytes,Ua: NEGATIVE
Nitrite: NEGATIVE
Protein, ur: NEGATIVE mg/dL
Specific Gravity, Urine: 1.009 (ref 1.005–1.030)
pH: 6 (ref 5.0–8.0)

## 2023-01-22 LAB — POCT FERN TEST: POCT Fern Test: NEGATIVE

## 2023-01-22 NOTE — Telephone Encounter (Signed)
Patient called stating she has noticed a "chlorine smell" in her vaginal area as well as her underwear being damp.  States she has been sweating but has been inside all day. Having occasional contractions but not more that what she has been having.  Denies bleeding and baby is active. Informed it could be amniotic fluid as it can have an odor. Advised to take a shower just to see since she has been sweating, if that takes the odor away.  If not and still continues to feel damp, to go to MAU to rule out rupture.  Pt verbalized understanding and agreeable to plan.

## 2023-01-22 NOTE — MAU Note (Signed)
.  Linda Jackson is a 32 y.o. at [redacted]w[redacted]d here in MAU reporting: yesterday underwear was wet and smelt like chlorine. Today underwear still has felt damp, but no odor to it now. Denies VB. Reports ctx but they have been 45 minutes apart. Also having lower back pain.   Pain score: 7 - back; 6 - ctx; Vitals:   01/22/23 1936  BP: 120/74  Pulse: (!) 108  Resp: 18  Temp: 98.2 F (36.8 C)  SpO2: 96%     FHT:160 Lab orders placed from triage:  UA; fern

## 2023-01-22 NOTE — MAU Provider Note (Signed)
Chief Complaint:  Contractions and Vaginal Discharge   Event Date/Time   First Provider Initiated Contact with Patient 01/22/23 2010    HPI: Linda Jackson is a 32 y.o. [phone] at 14w0dwho presents to maternity admissions reporting wetness since yesterday.  Had a mild odor to it yesterday.  Not today.   Occasional contractions.  No fluid running down legs or soaking anything.  History of preterm labor, uses procardia prn. . She reports good fetal movement, denies vaginal bleeding, vaginal itching/burning, urinary symptoms, h/a, dizziness, n/v, diarrhea, constipation or fever/chills.   Vaginal Discharge The patient's primary symptoms include pelvic pain and vaginal discharge. The patient's pertinent negatives include no genital itching, genital odor or vaginal bleeding. The current episode started yesterday. The problem has been unchanged. She is pregnant. Pertinent negatives include no constipation, diarrhea, fever, frequency, nausea or vomiting. The vaginal discharge was clear. There has been no bleeding. She has not been passing clots. She has not been passing tissue.     RN Note: Linda Jackson is a 32 y.o. at [redacted]w[redacted]d here in MAU reporting: yesterday underwear was wet and smelt like chlorine. Today underwear still has felt damp, but no odor to it now. Denies VB. Reports ctx but they have been 45 minutes apart. Also having lower back pain.      Past Medical History: Past Medical History:  Diagnosis Date   Bipolar 1 disorder (HCC)    Chronic back pain    Depression     Past obstetric history: OB History  Gravida Para Term Preterm AB Living  4 3 3     3   SAB IAB Ectopic Multiple Live Births        0 3    # Outcome Date GA Lbr Len/2nd Weight Sex Type Anes PTL Lv  4 Current           3 Term 06/11/14 [redacted]w[redacted]d 27:21 / 00:27 4100 g F Vag-Spont EPI N LIV  2 Term 09/28/12 [redacted]w[redacted]d  3827 g F Vag-Spont EPI N LIV     Birth Comments: partial placental abruption  1 Term 11/21/11 [redacted]w[redacted]d  3714 g F  Vag-Spont EPI N LIV    Past Surgical History: Past Surgical History:  Procedure Laterality Date   CHOLECYSTECTOMY     FEMUR IM NAIL Left 05/25/2020   Procedure: INTRAMEDULLARY (IM) NAIL FEMORAL;  Surgeon: Roby Lofts, MD;  Location: [facility] OR;  Service: Orthopedics;  Laterality: Left;   TONSILLECTOMY     TYMPANOSTOMY TUBE PLACEMENT  1997    Family History: Family History  Problem Relation Age of Onset   Depression Mother    Kidney disease Mother    Stroke Mother    ADD / ADHD Brother    COPD Maternal Grandmother    Cancer Maternal Grandfather        brain   Diabetes Paternal Grandmother    Breast cancer Paternal Grandmother    Diabetes Paternal Grandfather    Hypertension Paternal Grandfather    Parkinson's disease Paternal Grandfather     Social History: Social History   Tobacco Use   Smoking status: Every Day    Current packs/day: 0.25    Types: Cigarettes   Smokeless tobacco: Never  Vaping Use   Vaping status: Some Days  Substance Use Topics   Alcohol use: No   Drug use: Not Currently    Types: Marijuana, "Crack" cocaine, Cocaine, Methamphetamines    Allergies:  Allergies  Allergen Reactions   Morphine And Codeine Itching    "  tried to claw my eyes out"   Cefzil [Cefprozil] Hives   Lexapro [Escitalopram Oxalate] Nausea And Vomiting   Red Dye     Meds:  Medications Prior to Admission  Medication Sig Dispense Refill Last Dose   acetaminophen (TYLENOL) 500 MG tablet Take 500 mg by mouth every 6 (six) hours as needed for headache. Pt reports taking 2 tablets today for HA      aspirin EC 81 MG tablet Take 1 tablet (81 mg total) by mouth daily. Swallow whole. 90 tablet 3    metroNIDAZOLE (FLAGYL) 500 MG tablet Take 1 tablet (500 mg total) by mouth 2 (two) times daily. 14 tablet 0    NIFEdipine (PROCARDIA) 10 MG capsule Take 1 capsule (10 mg total) by mouth every 6 (six) hours as needed (increased uterine activity). 20 capsule 1    Prenatal Vit-Fe Fumarate-FA  (PRENATAL VITAMIN PO) Take by mouth.      traZODone (DESYREL) 100 MG tablet Take 1 tablet (100 mg total) by mouth at bedtime as needed for sleep. 30 tablet 1     I have reviewed patient's Past Medical Hx, Surgical Hx, Family Hx, Social Hx, medications and allergies.   ROS:  Review of Systems  Constitutional:  Negative for fever.  Gastrointestinal:  Negative for constipation, diarrhea, nausea and vomiting.  Genitourinary:  Positive for pelvic pain and vaginal discharge. Negative for frequency.   Other systems negative  Physical Exam  Patient Vitals for the past 24 hrs:  BP Temp Temp src Pulse Resp SpO2 Height Weight  01/22/23 1936 120/74 98.2 F (36.8 C) Oral (!) 108 18 96 % 5\' 1"  (1.549 m) 100.2 kg   Constitutional: Well-developed, well-nourished female in no acute distress.  Cardiovascular: normal rate and rhythm Respiratory: normal effort, clear to auscultation bilaterally GI: Abd soft, non-tender, gravid appropriate for gestational age.   No rebound or guarding. MS: Extremities nontender, no edema, normal ROM Neurologic: Alert and oriented x 4.  GU: Neg CVAT.  PELVIC EXAM: Cervix pink, visually closed, without lesion, scant white creamy discharge, No pooling, vaginal walls and external genitalia normal  Dilation: Fingertip Effacement (%): 40 Exam by:: Wynelle Bourgeois, CNM  FHT:  Baseline 150 , moderate variability, accelerations present, no decelerations Contractions: Occasional    Labs: Results for orders placed or performed during the hospital encounter of 01/22/23 (from the past 24 hour(s))  Urinalysis, Routine w reflex microscopic -Urine, Clean Catch     Status: None   Collection Time: 01/22/23  8:02 PM  Result Value Ref Range   Color, Urine YELLOW YELLOW   APPearance CLEAR CLEAR   Specific Gravity, Urine 1.009 1.005 - 1.030   pH 6.0 5.0 - 8.0   Glucose, UA NEGATIVE NEGATIVE mg/dL   Hgb urine dipstick NEGATIVE NEGATIVE   Bilirubin Urine NEGATIVE NEGATIVE    Ketones, ur NEGATIVE NEGATIVE mg/dL   Protein, ur NEGATIVE NEGATIVE mg/dL   Nitrite NEGATIVE NEGATIVE   Leukocytes,Ua NEGATIVE NEGATIVE  Fern Test     Status: None   Collection Time: 01/22/23  8:45 PM  Result Value Ref Range   POCT Fern Test Negative = intact amniotic membranes     O/Positive/-- (02/12 1515)  Imaging:    MAU Course/MDM: I have reviewed the triage vital signs and the nursing notes.   Pertinent labs & imaging results that were available during my care of the patient were reviewed by me and considered in my medical decision making (see chart for details).  I have reviewed her medical records including past results, notes and treatments.   I have ordered labs and reviewed results. UA is clear, neg fern NST reviewed .  Treatments in MAU included EFM, SSE.    Assessment: Single IUP at [redacted]w[redacted]d Vaginal discharge, no evidence of ruptured membranes Reactive NST  Plan: Discharge home Labor precautions and fetal kick counts Follow up in Office for prenatal visits and recheck Encouraged to return if she develops worsening of symptoms, increase in pain, fever, or other concerning symptoms.   Pt stable at time of discharge.  Wynelle Bourgeois CNM, MSN Certified Nurse-Midwife 01/22/2023 8:10 PM

## 2023-01-30 ENCOUNTER — Encounter: Payer: Self-pay | Admitting: Advanced Practice Midwife

## 2023-01-30 ENCOUNTER — Ambulatory Visit: Payer: MEDICAID | Admitting: Advanced Practice Midwife

## 2023-01-30 VITALS — BP 131/84 | HR 106 | Wt 221.0 lb

## 2023-01-30 DIAGNOSIS — Z3480 Encounter for supervision of other normal pregnancy, unspecified trimester: Secondary | ICD-10-CM

## 2023-01-30 DIAGNOSIS — Z3A36 36 weeks gestation of pregnancy: Secondary | ICD-10-CM

## 2023-01-30 NOTE — Patient Instructions (Signed)

## 2023-01-30 NOTE — Progress Notes (Signed)
   LOW-RISK PREGNANCY VISIT Patient name: Linda Jackson MRN [id number]  Date of birth: 11-Feb-1991 Chief Complaint:   Routine Prenatal Visit  History of Present Illness:   Linda Jackson is a 32 y.o. G51P3003 female at [redacted]w[redacted]d with an Estimated Date of Delivery: 02/26/23 being seen today for ongoing management of a low-risk pregnancy.  Today she reports normal pregnancy complaints. Contractions: Irregular. Vag. Bleeding: None.  Movement: Present. denies leaking of fluid. Review of Systems:   Pertinent items are noted in HPI Denies abnormal vaginal discharge w/ itching/odor/irritation, headaches, visual changes, shortness of breath, chest pain, abdominal pain, severe nausea/vomiting, or problems with urination or bowel movements unless otherwise stated above. Pertinent History Reviewed:  Reviewed past medical,surgical, social, obstetrical and family history.  Reviewed problem list, medications and allergies. Physical Assessment:   Vitals:   01/30/23 1049  BP: 131/84  Pulse: (!) 106  Weight: 221 lb (100.2 kg)  Body mass index is 41.76 kg/m.        Physical Examination:   General appearance: Well appearing, and in no distress  Mental status: Alert, oriented to person, place, and time  Skin: Warm & dry  Cardiovascular: Normal heart rate noted  Respiratory: Normal respiratory effort, no distress  Abdomen: Soft, gravid, nontender  Pelvic: Cervical exam performed  Dilation: 1 Effacement (%): Thick Station: -3  Extremities:    Fetal Status: Fetal Heart Rate (bpm): 142 Fundal Height: 38 cm Movement: Present Presentation: Vertex  Chaperone:   Shewrry, RN     No results found for this or any previous visit (from the past 24 hour(s)).  Assessment & Plan:    Pregnancy: G4P3003 at [redacted]w[redacted]d 1. [redacted] weeks gestation of pregnancy   2. Supervision of other normal pregnancy, antepartum      Meds: No orders of the defined types were placed in this encounter.  Labs/procedures today: GBS (  had Neg GC, CHL 2 weeks ago)  Plan:  Continue routine obstetrical care  Next visit: prefers in person    Reviewed: Term labor symptoms and general obstetric precautions including but not limited to vaginal bleeding, contractions, leaking of fluid and fetal movement were reviewed in detail with the patient.  All questions were answered. Has home bp cuff.. Check bp weekly, let us know if >140/90.   Follow-up: Return for As scheduled.  Future Appointments  Date Time Provider Department Center  02/06/2023 11:30 AM Jacklyn Shell, CNM [facility]  02/12/2023 10:50 AM Arabella Merles, CNM [facility]  02/19/2023 11:10 AM Arabella Merles, CNM [facility]  02/26/2023 11:10 AM Arabella Merles, CNM [facility]    No orders of the defined types were placed in this encounter.  Jacklyn Shell DNP, CNM 01/30/2023 11:35 AM  **I DON'T SEE ANY DOCUMENTATION FROM LAST VISIT REGARDING BLOOD SUGARS--message sent to pt**

## 2023-02-06 ENCOUNTER — Encounter: Payer: Self-pay | Admitting: Advanced Practice Midwife

## 2023-02-06 ENCOUNTER — Ambulatory Visit: Payer: MEDICAID | Admitting: Advanced Practice Midwife

## 2023-02-06 VITALS — BP 120/83 | HR 108 | Wt 223.0 lb

## 2023-02-06 DIAGNOSIS — Z3480 Encounter for supervision of other normal pregnancy, unspecified trimester: Secondary | ICD-10-CM

## 2023-02-06 DIAGNOSIS — Z3A37 37 weeks gestation of pregnancy: Secondary | ICD-10-CM

## 2023-02-06 MED ORDER — TRAZODONE HCL 100 MG PO TABS: 100.0000 mg | ORAL_TABLET | Freq: Every evening | ORAL | 1 refills | Status: AC | PRN

## 2023-02-06 NOTE — Patient Instructions (Signed)

## 2023-02-06 NOTE — Progress Notes (Signed)
   LOW-RISK PREGNANCY VISIT Patient name: Linda Jackson MRN [id number]  Date of birth: [birth year] Chief Complaint:   Routine Prenatal Visit (Cervical check)  History of Present Illness:   Linda Jackson is a 32 y.o. [phone] female at [redacted]w[redacted]d with an Estimated Date of Delivery: 02/26/23 being seen today for ongoing management of a low-risk pregnancy.  Today she reports normal pregnancy complaints. Needs trazadone refil.. Contractions: Irritability.  .  Movement: Present. denies leaking of fluid. Asked about blood sugars:  only checked a few times, before eating.  89 this morning. Has not been over 100 after a meal.   Review of Systems:   Pertinent items are noted in HPI Denies abnormal vaginal discharge w/ itching/odor/irritation, headaches, visual changes, shortness of breath, chest pain, abdominal pain, severe nausea/vomiting, or problems with urination or bowel movements unless otherwise stated above. Pertinent History Reviewed:  Reviewed past medical,surgical, social, obstetrical and family history.  Reviewed problem list, medications and allergies. Physical Assessment:   Vitals:   02/06/23 1113  BP: 120/83  Pulse: (!) 108  Weight: 223 lb (101.2 kg)  Body mass index is 42.14 kg/m.        Physical Examination:   General appearance: Well appearing, and in no distress  Mental status: Alert, oriented to person, place, and time  Skin: Warm & dry  Cardiovascular: Normal heart rate noted  Respiratory: Normal respiratory effort, no distress  Abdomen: Soft, gravid, nontender  Pelvic: Cervical exam performed  Dilation: 1.5 Effacement (%): Thick Station: -3  Extremities: Edema: None  Fetal Status: Fetal Heart Rate (bpm): 130 Fundal Height: 39 cm Movement: Present    Chaperone:  Peggy Dones    No results found for this or any previous visit (from the past 24 hour(s)).  Assessment & Plan:    Pregnancy: G4P3003 at [redacted]w[redacted]d 1. [redacted] weeks gestation of pregnancy   2. Supervision of other  normal pregnancy, antepartum      Meds:  Meds ordered this encounter  Medications   traZODone (DESYREL) 100 MG tablet    Sig: Take 1 tablet (100 mg total) by mouth at bedtime as needed for sleep.    Dispense:  30 tablet    Refill:  1    Order Specific Question:   Supervising Provider    Answer:   Lazaro Arms [[id number]]   Labs/procedures today:   Plan:  Continue routine obstetrical care  Next visit: prefers in person    Reviewed: Term labor symptoms and general obstetric precautions including but not limited to vaginal bleeding, contractions, leaking of fluid and fetal movement were reviewed in detail with the patient.  All questions were answered. Has home bp cuff. Check bp weekly, let us know if >140/90.   Follow-up: Return for As scheduled.  Future Appointments  Date Time Provider Department Center  02/12/2023 10:50 AM Arabella Merles, CNM [facility]  02/19/2023 11:10 AM Arabella Merles, CNM [facility]  02/26/2023 11:10 AM Arabella Merles, CNM [facility]    No orders of the defined types were placed in this encounter.  Jacklyn Shell DNP, CNM 02/06/2023 11:39 AM

## 2023-02-12 ENCOUNTER — Ambulatory Visit: Payer: MEDICAID | Admitting: Advanced Practice Midwife

## 2023-02-12 ENCOUNTER — Encounter: Payer: Self-pay | Admitting: Advanced Practice Midwife

## 2023-02-12 VITALS — BP 121/83 | HR 97 | Wt 225.0 lb

## 2023-02-12 DIAGNOSIS — Z3480 Encounter for supervision of other normal pregnancy, unspecified trimester: Secondary | ICD-10-CM

## 2023-02-12 DIAGNOSIS — Z3A38 38 weeks gestation of pregnancy: Secondary | ICD-10-CM

## 2023-02-12 NOTE — Progress Notes (Signed)
   LOW-RISK PREGNANCY VISIT Patient name: Linda Jackson MRN [id number]  Date of birth: [birth year] Chief Complaint:   Routine Prenatal Visit  History of Present Illness:   Linda Jackson is a 32 y.o. G75P3003 female at 105w0d with an Estimated Date of Delivery: 02/26/23 being seen today for ongoing management of a low-risk pregnancy.  Today she reports  being ready for labor! . Contractions: Irregular. Vag. Bleeding: None.  Movement: Present. denies leaking of fluid. Review of Systems:   Pertinent items are noted in HPI Denies abnormal vaginal discharge w/ itching/odor/irritation, headaches, visual changes, shortness of breath, chest pain, abdominal pain, severe nausea/vomiting, or problems with urination or bowel movements unless otherwise stated above. Pertinent History Reviewed:  Reviewed past medical,surgical, social, obstetrical and family history.  Reviewed problem list, medications and allergies. Physical Assessment:   Vitals:   02/12/23 1058  BP: 121/83  Pulse: 97  Weight: 225 lb (102.1 kg)  Body mass index is 42.51 kg/m.        Physical Examination:   General appearance: Well appearing, and in no distress  Mental status: Alert, oriented to person, place, and time  Skin: Warm & dry  Cardiovascular: Normal heart rate noted  Respiratory: Normal respiratory effort, no distress  Abdomen: Soft, gravid, nontender  Pelvic: Cervical exam performed  Dilation: 1.5 Effacement (%): Thick Station: -3  Extremities: Edema: None  Fetal Status: Fetal Heart Rate (bpm): 147 Fundal Height: 38 cm Movement: Present Presentation: Vertex  No results found for this or any previous visit (from the past 24 hour(s)).  Assessment & Plan:  1) Low-risk pregnancy G4P3003 at [redacted]w[redacted]d with an Estimated Date of Delivery: 02/26/23   2) No GTT, states had fasting/PP CBG values at home that were <95, <120 as appropriate   Meds: No orders of the defined types were placed in this encounter.  Labs/procedures  today: SVE  Plan:  Continue routine obstetrical care with membrane sweep next week if pregnant  Reviewed: Term labor symptoms and general obstetric precautions including but not limited to vaginal bleeding, contractions, leaking of fluid and fetal movement were reviewed in detail with the patient.  All questions were answered. Has home bp cuff. Check bp weekly, let us know if >140/90.   Follow-up: Return for As scheduled.  No orders of the defined types were placed in this encounter.  Arabella Merles CNM 02/12/2023 11:17 AM

## 2023-02-19 ENCOUNTER — Other Ambulatory Visit: Payer: Medicaid Other

## 2023-02-19 ENCOUNTER — Ambulatory Visit (INDEPENDENT_AMBULATORY_CARE_PROVIDER_SITE_OTHER): Payer: MEDICAID | Admitting: Advanced Practice Midwife

## 2023-02-19 ENCOUNTER — Inpatient Hospital Stay (HOSPITAL_COMMUNITY): Payer: MEDICAID | Admitting: Anesthesiology

## 2023-02-19 ENCOUNTER — Encounter (HOSPITAL_COMMUNITY): Payer: Self-pay | Admitting: Obstetrics and Gynecology

## 2023-02-19 ENCOUNTER — Inpatient Hospital Stay (HOSPITAL_COMMUNITY)
Admission: AD | Admit: 2023-02-19 | Discharge: 2023-02-21 | DRG: 807 | Disposition: A | Discharge status: Home / Self Care | Payer: MEDICAID | Attending: Obstetrics and Gynecology | Admitting: Obstetrics and Gynecology

## 2023-02-19 ENCOUNTER — Other Ambulatory Visit: Payer: Self-pay

## 2023-02-19 ENCOUNTER — Encounter: Payer: Self-pay | Admitting: Advanced Practice Midwife

## 2023-02-19 VITALS — BP 126/91 | HR 112 | Wt 224.0 lb

## 2023-02-19 DIAGNOSIS — Z8759 Personal history of other complications of pregnancy, childbirth and the puerperium: Secondary | ICD-10-CM | POA: Diagnosis present

## 2023-02-19 DIAGNOSIS — O133 Gestational [pregnancy-induced] hypertension without significant proteinuria, third trimester: Secondary | ICD-10-CM

## 2023-02-19 DIAGNOSIS — O134 Gestational [pregnancy-induced] hypertension without significant proteinuria, complicating childbirth: Secondary | ICD-10-CM | POA: Diagnosis not present

## 2023-02-19 DIAGNOSIS — O99334 Smoking (tobacco) complicating childbirth: Secondary | ICD-10-CM | POA: Diagnosis present

## 2023-02-19 DIAGNOSIS — O139 Gestational [pregnancy-induced] hypertension without significant proteinuria, unspecified trimester: Principal | ICD-10-CM | POA: Diagnosis present

## 2023-02-19 DIAGNOSIS — F17210 Nicotine dependence, cigarettes, uncomplicated: Secondary | ICD-10-CM | POA: Diagnosis present

## 2023-02-19 DIAGNOSIS — Z3A39 39 weeks gestation of pregnancy: Secondary | ICD-10-CM | POA: Diagnosis not present

## 2023-02-19 DIAGNOSIS — O99214 Obesity complicating childbirth: Secondary | ICD-10-CM | POA: Diagnosis present

## 2023-02-19 DIAGNOSIS — F1991 Other psychoactive substance use, unspecified, in remission: Secondary | ICD-10-CM | POA: Diagnosis present

## 2023-02-19 DIAGNOSIS — O99344 Other mental disorders complicating childbirth: Secondary | ICD-10-CM | POA: Diagnosis not present

## 2023-02-19 DIAGNOSIS — F418 Other specified anxiety disorders: Secondary | ICD-10-CM | POA: Diagnosis present

## 2023-02-19 DIAGNOSIS — F17200 Nicotine dependence, unspecified, uncomplicated: Secondary | ICD-10-CM | POA: Diagnosis present

## 2023-02-19 DIAGNOSIS — Z3480 Encounter for supervision of other normal pregnancy, unspecified trimester: Secondary | ICD-10-CM

## 2023-02-19 DIAGNOSIS — F411 Generalized anxiety disorder: Secondary | ICD-10-CM | POA: Diagnosis present

## 2023-02-19 LAB — COMPREHENSIVE METABOLIC PANEL
ALT: 13 U/L (ref 0–44)
AST: 23 U/L (ref 15–41)
Albumin: 2.7 g/dL — ABNORMAL LOW (ref 3.5–5.0)
Alkaline Phosphatase: 162 U/L — ABNORMAL HIGH (ref 38–126)
Anion gap: 12 (ref 5–15)
BUN: 6 mg/dL (ref 6–20)
CO2: 18 mmol/L — ABNORMAL LOW (ref 22–32)
Calcium: 8.7 mg/dL — ABNORMAL LOW (ref 8.9–10.3)
Chloride: 103 mmol/L (ref 98–111)
Creatinine, Ser: 0.54 mg/dL (ref 0.44–1.00)
GFR, Estimated: >60 mL/min (ref >60)
Glucose, Bld: 105 mg/dL — ABNORMAL HIGH (ref 70–99)
Potassium: 3.2 mmol/L — ABNORMAL LOW (ref 3.5–5.1)
Sodium: 133 mmol/L — ABNORMAL LOW (ref 135–145)
Total Bilirubin: 0.2 mg/dL — ABNORMAL LOW (ref 0.3–1.2)
Total Protein: 6.4 g/dL — ABNORMAL LOW (ref 6.5–8.1)

## 2023-02-19 LAB — RAPID URINE DRUG SCREEN, HOSP PERFORMED
Amphetamines: NOT DETECTED
Barbiturates: NOT DETECTED
Benzodiazepines: NOT DETECTED
Cocaine: NOT DETECTED
Opiates: NOT DETECTED
Tetrahydrocannabinol: NOT DETECTED

## 2023-02-19 LAB — CBC
HCT: 36.8 % (ref 36.0–46.0)
Hemoglobin: 12.3 g/dL (ref 12.0–15.0)
MCH: 31.7 pg (ref 26.0–34.0)
MCHC: 33.4 g/dL (ref 30.0–36.0)
MCV: 94.8 fL (ref 80.0–100.0)
Platelets: 188 10*3/uL (ref 150–400)
RBC: 3.88 MIL/uL (ref 3.87–5.11)
RDW: 14.1 % (ref 11.5–15.5)
WBC: 12 10*3/uL — ABNORMAL HIGH (ref 4.0–10.5)
nRBC: 0 % (ref 0.0–0.2)

## 2023-02-19 LAB — PROTEIN / CREATININE RATIO, URINE
Creatinine, Urine: 160 mg/dL
Protein Creatinine Ratio: 0.13 mg/mg{Cre} (ref 0.00–0.15)
Total Protein, Urine: 21 mg/dL

## 2023-02-19 LAB — TYPE AND SCREEN
ABO/RH(D): O POS
Antibody Screen: NEGATIVE

## 2023-02-19 MED ORDER — ACETAMINOPHEN 325 MG PO TABS: 650.0000 mg | ORAL_TABLET | ORAL | Status: DC | PRN

## 2023-02-19 MED ORDER — PHENYLEPHRINE 80 MCG/ML (10ML) SYRINGE FOR IV PUSH (FOR BLOOD PRESSURE SUPPORT): 80.0000 ug | PREFILLED_SYRINGE | INTRAVENOUS | Status: DC | PRN

## 2023-02-19 MED ORDER — OXYTOCIN-SODIUM CHLORIDE 30-0.9 UT/500ML-% IV SOLN
1.0000 m[IU]/min | INTRAVENOUS | Status: DC
  Administered 2023-02-19: 2 m[IU]/min via INTRAVENOUS
  Filled 2023-02-19: qty 500

## 2023-02-19 MED ORDER — EPHEDRINE 5 MG/ML INJ: 10.0000 mg | INTRAVENOUS | Status: DC | PRN

## 2023-02-19 MED ORDER — LACTATED RINGERS IV SOLN: INTRAVENOUS | Status: DC

## 2023-02-19 MED ORDER — OXYTOCIN-SODIUM CHLORIDE 30-0.9 UT/500ML-% IV SOLN: 2.5000 [IU]/h | INTRAVENOUS | Status: DC

## 2023-02-19 MED ORDER — LACTATED RINGERS IV SOLN: 500.0000 mL | INTRAVENOUS | Status: DC | PRN

## 2023-02-19 MED ORDER — MISOPROSTOL 25 MCG QUARTER TABLET
25.0000 ug | ORAL_TABLET | Freq: Once | ORAL | Status: AC
  Administered 2023-02-19: 25 ug via VAGINAL
  Filled 2023-02-19: qty 1

## 2023-02-19 MED ORDER — TERBUTALINE SULFATE 1 MG/ML IJ SOLN: 0.2500 mg | Freq: Once | INTRAMUSCULAR | Status: DC | PRN

## 2023-02-19 MED ORDER — ONDANSETRON HCL 4 MG/2ML IJ SOLN: 4.0000 mg | Freq: Four times a day (QID) | INTRAMUSCULAR | Status: DC | PRN

## 2023-02-19 MED ORDER — MISOPROSTOL 50MCG HALF TABLET
50.0000 ug | ORAL_TABLET | Freq: Once | ORAL | Status: AC
  Administered 2023-02-19: 50 ug via ORAL
  Filled 2023-02-19: qty 1

## 2023-02-19 MED ORDER — FENTANYL CITRATE (PF) 100 MCG/2ML IJ SOLN
100.0000 ug | INTRAMUSCULAR | Status: DC | PRN
  Administered 2023-02-19: 100 ug via INTRAVENOUS
  Filled 2023-02-19: qty 2

## 2023-02-19 MED ORDER — DIPHENHYDRAMINE HCL 50 MG/ML IJ SOLN: 12.5000 mg | INTRAMUSCULAR | Status: DC | PRN

## 2023-02-19 MED ORDER — OXYCODONE-ACETAMINOPHEN 5-325 MG PO TABS: 2.0000 | ORAL_TABLET | ORAL | Status: DC | PRN

## 2023-02-19 MED ORDER — LACTATED RINGERS IV SOLN: 500.0000 mL | Freq: Once | INTRAVENOUS | Status: DC

## 2023-02-19 MED ORDER — OXYTOCIN BOLUS FROM INFUSION
333.0000 mL | Freq: Once | INTRAVENOUS | Status: AC
  Administered 2023-02-20: 333 mL via INTRAVENOUS

## 2023-02-19 MED ORDER — SOD CITRATE-CITRIC ACID 500-334 MG/5ML PO SOLN: 30.0000 mL | ORAL | Status: DC | PRN

## 2023-02-19 MED ORDER — FENTANYL-BUPIVACAINE-NACL 0.5-0.125-0.9 MG/250ML-% EP SOLN
12.0000 mL/h | EPIDURAL | Status: DC | PRN
  Administered 2023-02-19: 12 mL/h via EPIDURAL
  Filled 2023-02-19: qty 250

## 2023-02-19 MED ORDER — LIDOCAINE HCL (PF) 1 % IJ SOLN: 30.0000 mL | INTRAMUSCULAR | Status: DC | PRN

## 2023-02-19 MED ORDER — OXYCODONE-ACETAMINOPHEN 5-325 MG PO TABS: 1.0000 | ORAL_TABLET | ORAL | Status: DC | PRN

## 2023-02-19 MED ORDER — LIDOCAINE HCL (PF) 1 % IJ SOLN
INTRAMUSCULAR | Status: DC | PRN
  Administered 2023-02-19: 5 mL via EPIDURAL
  Administered 2023-02-19: 3 mL via EPIDURAL

## 2023-02-19 NOTE — Progress Notes (Signed)
Labor Progress Note  AMBRE KUCHARCZYK is a 32 y.o. [phone] at [redacted]w[redacted]d presented for IOL for gHTN.  S: Patient comfortable s/p AROM.  O:  BP 108/64   Pulse 82   Temp 98.1 F (36.7 C) (Oral)   Resp 18   Ht 5\' 1"  (1.549 m)   Wt 101.6 kg   LMP 05/22/2022   SpO2 99%   BMI 42.32 kg/m  EFM:125 bpm/Moderate variability/ 15x15 accels/ Early decels CAT: 1 Toco: irregular, every 2-5 minutes   CVE: Dilation: 4 Effacement (%): 60 Station: -3 Presentation: Vertex Exam by:: A.Hairston,RN   A&P: 32 y.o. [phone] [redacted]w[redacted]d here for IOL as above.  #Labor: Conducted AROM. Continue Pitocin. Anticipate vaginal delivery. #Pain: Epidural #FWB: CAT 1 #GBS negative  [place] E , Medical Student [facility], Center for [facility] 02/19/23  11:48 PM

## 2023-02-19 NOTE — Progress Notes (Signed)
   HIGH-RISK PREGNANCY VISIT Patient name: Linda Jackson MRN [id number]  Date of birth: [birth year] Chief Complaint:   Routine Prenatal Visit (Cervical check)  History of Present Illness:   Linda Jackson is a 32 y.o. [phone] female at [redacted]w[redacted]d with an Estimated Date of Delivery: 02/26/23 being seen today for ongoing management of a high-risk pregnancy complicated by probable gestational hypertension by 2 elevated values today. Denies s/s pre-e.  Today she reports no complaints. Contractions: Irregular.  .  Movement: Present. denies leaking of fluid.      12/04/2022    9:56 AM 08/19/2022    2:27 PM 07/04/2022    9:20 AM  Depression screen PHQ 2/9  Decreased Interest 1 0 0  Down, Depressed, Hopeless 2 0 1  PHQ - 2 Score 3 0 1  Altered sleeping 1 2 2   Tired, decreased energy 2 1 2   Change in appetite 0 1 0  Feeling bad or failure about yourself  2 0 0  Trouble concentrating 2 0 0  Moving slowly or fidgety/restless 0 0 0  Suicidal thoughts 0 0 0  PHQ-9 Score 10 4 5   Difficult doing work/chores Somewhat difficult          08/19/2022    2:28 PM 07/04/2022    9:20 AM  GAD 7 : Generalized Anxiety Score  Nervous, Anxious, on Edge 0 2  Control/stop worrying 0 2  Worry too much - different things 0 2  Trouble relaxing 0 1  Restless 1 0  Easily annoyed or irritable 1 2  Afraid - awful might happen 0 0  Total GAD 7 Score 2 9     Review of Systems:   Pertinent items are noted in HPI Denies abnormal vaginal discharge w/ itching/odor/irritation, headaches, visual changes, shortness of breath, chest pain, abdominal pain, severe nausea/vomiting, or problems with urination or bowel movements unless otherwise stated above. Pertinent History Reviewed:  Reviewed past medical,surgical, social, obstetrical and family history.  Reviewed problem list, medications and allergies. Physical Assessment:   Vitals:   02/19/23 1111 02/19/23 1131  BP: (!) 135/94 (!) 126/91  Pulse: (!) 108 (!) 112   Weight: 224 lb (101.6 kg)   Body mass index is 42.32 kg/m.           Physical Examination:   General appearance: alert, well appearing, and in no distress  Mental status: alert, oriented to person, place, and time  Skin: warm & dry   Extremities: Edema: None    Cardiovascular: normal heart rate noted  Respiratory: normal respiratory effort, no distress  Abdomen: gravid, soft, non-tender  Pelvic: Cervical exam performed  Dilation: 1.5 Effacement (%): Thick Station: -3  Fetal Status: Fetal Heart Rate (bpm): 130   Movement: Present Presentation: Vertex  Fetal Surveillance Testing today: doppler    No results found for this or any previous visit (from the past 24 hour(s)).  Assessment & Plan:  High-risk pregnancy: [id number] at [redacted]w[redacted]d with an Estimated Date of Delivery: 02/26/23   1) Probable gHTN, plan for IOL with cyto/foley; will put in orders/labs   Meds: No orders of the defined types were placed in this encounter.   Labs/procedures today: SVE  Treatment Plan:  IOL   Follow-up: Return for cancel next OB appt; 10d RN BP check; 6wk PP with IUD.   No future appointments.  No orders of the defined types were placed in this encounter.  Arabella Merles CNM 02/19/2023 11:43 AM

## 2023-02-19 NOTE — Anesthesia Preprocedure Evaluation (Signed)
Anesthesia Evaluation  Patient identified by MRN, date of birth, ID band Patient awake    Reviewed: Allergy & Precautions, NPO status , Patient's Chart, lab work & pertinent test results  History of Anesthesia Complications Negative for: history of anesthetic complications  Airway Mallampati: III       Dental   Pulmonary Current Smoker   Pulmonary exam normal breath sounds clear to auscultation       Cardiovascular hypertension (gestational),  Rhythm:Regular Rate:Normal     Neuro/Psych  PSYCHIATRIC DISORDERS Anxiety Depression Bipolar Disorder      GI/Hepatic negative GI ROS,,,(+)     substance abuse    Endo/Other  neg diabetes  Morbid obesity  Renal/GU negative Renal ROS     Musculoskeletal   Abdominal  (+) + obese  Peds  Hematology negative hematology ROS (+) Lab Results      Component                Value               Date                      WBC                      12.0 (H)            02/19/2023                HGB                      12.3                02/19/2023                HCT                      36.8                02/19/2023                MCV                      94.8                02/19/2023                PLT                      188                 02/19/2023              Anesthesia Other Findings   Reproductive/Obstetrics (+) Pregnancy                              Anesthesia Physical Anesthesia Plan  ASA: 3  Anesthesia Plan: Epidural   Post-op Pain Management:    Induction:   PONV Risk Score and Plan:   Airway Management Planned:   Additional Equipment:   Intra-op Plan:   Post-operative Plan:   Informed Consent: I have reviewed the patients History and Physical, chart, labs and discussed the procedure including the risks, benefits and alternatives for the proposed anesthesia with the patient or authorized representative who has indicated his/her  understanding and acceptance.       Plan Discussed  with: Anesthesiologist  Anesthesia Plan Comments: (I have discussed risks of neuraxial anesthesia including but not limited to infection, bleeding, nerve injury, back pain, headache, seizures, and failure of block. Patient denies bleeding disorders and is not currently anticoagulated. Labs have been reviewed. Risks and benefits discussed. All patient's questions answered.  )         Anesthesia Quick Evaluation

## 2023-02-19 NOTE — Plan of Care (Signed)

## 2023-02-19 NOTE — Progress Notes (Signed)
Patient ID: Linda Jackson, female   DOB: [birth year], 32 y.o.   MRN: [id number]  Comfortable w epidural; s/p cervical foley and cytotec  BPs 120/75, 108/73 FHR 130s, +accels, no decels Ctx q 2-3 mins Cx deferred   Pre-e labs: neg  IUP@39 .0wks gHTN IOL process  Plan to start Pitocin now that she is finishing with some broth Uptitrate Pit to achieve active labor, and plan to AROM at some point Anticipate vag delivery  Arabella Merles CNM 02/19/2023

## 2023-02-19 NOTE — H&P (Signed)
Linda Jackson is a 32 y.o. female presenting for IOL for gHTN. OB History     Gravida  4   Para  3   Term  3   Preterm      AB      Living  3      SAB      IAB      Ectopic      Multiple  0   Live Births  3          Past Medical History:  Diagnosis Date   Bipolar 1 disorder (HCC)    Chronic back pain    Depression    Past Surgical History:  Procedure Laterality Date   CHOLECYSTECTOMY     FEMUR IM NAIL Left 05/25/2020   Procedure: INTRAMEDULLARY (IM) NAIL FEMORAL;  Surgeon: Roby Lofts, MD;  Location: [facility] OR;  Service: Orthopedics;  Laterality: Left;   TONSILLECTOMY     TYMPANOSTOMY TUBE PLACEMENT  1997   Family History: family history includes ADD / ADHD in her brother; Breast cancer in her paternal grandmother; COPD in her maternal grandmother; Cancer in her maternal grandfather; Depression in her mother; Diabetes in her paternal grandfather and paternal grandmother; Hypertension in her paternal grandfather; Kidney disease in her mother; Parkinson's disease in her paternal grandfather; Stroke in her mother. Social History:  reports that she has been smoking cigarettes. She has never used smokeless tobacco. She reports that she does not currently use drugs after having used the following drugs: Marijuana, "Crack" cocaine, Cocaine, and Methamphetamines. She reports that she does not drink alcohol.     FAMILY TREE   RESULTS  Language English Pap 3/23 [facility] neg per pt  Initiated care at [facility] c/w 9wk [place] GC/CT Initial:  -/-          36wks: -/-  Dating by 02/26/2023, by Last Menstrual Period      Support person Therese [employer] NT/IT:     AFP: declined    Panorama: LR female  BP cuff yes Carrier Screen declined      Woodson/Hgb Elec neg  Rhogam N/a      TDaP vaccine 6.27.24 Blood Type O/Positive/-- (02/12 1515)  Flu vaccine na Antibody Negative (02/12 1515)  Covid vaccine   HBsAg Negative (02/12 1515)      RPR Non Reactive (02/12 1515)  Anatomy US  Normal boy "Samuel [place]" Rubella  1.49 (02/12 1515)  Feeding Plan bottle HIV Non Reactive (02/12 1515)  Contraception IUD @ PP visit Hep C neg  Circumcision  yes      Pediatrician [facility] A1C/GTT Early:  5.2    26-28wks:  Prenatal Classes discussed          GBS Negative/-- (07/25 1330)   [ ]  PCN allergy  BTL Consent        VBAC Consent N/a PHQ9 & GAD7  [ ] New OB  [ ] 28wks   [ ] 36wks  Waterbirth [ ] Class [ ]  36wkCNM visit/consent             Review of Systems Maternal Medical History:  Fetal activity: Perceived fetal activity is normal.       Last menstrual period 05/22/2022. Maternal Exam:  Uterine Assessment: Contraction strength is mild.  Contraction frequency is irregular.  Abdomen: Patient reports no abdominal tenderness. Fetal presentation: vertex Pelvis: adequate for delivery.   Cervix: Cervix evaluated by digital exam.     Fetal Exam Fetal Monitor Review: Baseline rate: 115.  Variability: moderate (  6-25 bpm).   Pattern: accelerations present and no decelerations.   Fetal State Assessment: Category I - tracings are normal.   Physical Exam Vitals and nursing note reviewed.  Constitutional:      General: She is not in acute distress.    Appearance: Normal appearance.  HENT:     Head: Normocephalic.  Pulmonary:     Effort: Pulmonary effort is normal.  Genitourinary:    Comments: Dilation: 1.5 Effacement (%): Thick Station: Ballotable Presentation: Vertex Exam by:: Dorathy Daft, CNM  Musculoskeletal:     Cervical back: Normal range of motion.  Skin:    General: Skin is warm and dry.  Neurological:     Mental Status: She is alert and oriented to person, place, and time.  Psychiatric:        Mood and Affect: Mood normal.     Prenatal labs: ABO, Rh: O/Positive/-- (02/12 1515) Antibody: Negative (02/12 1515) Rubella: 1.49 (02/12 1515) RPR: Non Reactive (02/12 1515)  HBsAg: Negative (02/12 1515)  HIV: Non Reactive (02/12 1515)  GBS: Negative/-- (07/25 1330)    Assessment/Plan: IOL for gHTN. Hx of drug use. Last use in Nov. 2023.   Labor: FB placed without difficulty. Dual cytotec.  FWB: Cat I  Pain: epidural when ready. Patient may have epidural upon request.  PreE: preE labs pending Drug hx: UDS pending.  I/D: GBS negative  MOF: bottle MOC: IUD 6 week PP visit  MOD: NSVD    Claudette Head, MSN CNM  02/19/2023, 3:02 PM

## 2023-02-19 NOTE — Anesthesia Procedure Notes (Signed)
Epidural Patient location during procedure: OB Start time: 02/19/2023 8:22 PM End time: 02/19/2023 8:27 PM  Staffing Anesthesiologist: Linton Rump, MD Performed: anesthesiologist   Preanesthetic Checklist Completed: patient identified, IV checked, site marked, risks and benefits discussed, surgical consent, monitors and equipment checked, pre-op evaluation and timeout performed  Epidural Patient position: sitting Prep: DuraPrep and site prepped and draped Patient monitoring: continuous pulse ox and blood pressure Approach: midline Location: L3-L4 Injection technique: LOR saline  Needle:  Needle type: Tuohy  Needle gauge: 17 G Needle length: 9 cm and 9 Needle insertion depth: 7 cm Catheter type: closed end flexible Catheter size: 19 Gauge Catheter at skin depth: 12 cm Test dose: negative  Assessment Events: blood not aspirated, no cerebrospinal fluid, injection not painful, no injection resistance, no paresthesia and negative IV test  Additional Notes The patient has requested an epidural for labor pain management. Risks and benefits including, but not limited to, infection, bleeding, local anesthetic toxicity, headache, hypotension, back pain, block failure, etc. were discussed with the patient. The patient expressed understanding and consented to the procedure. I confirmed that the patient has no bleeding disorders and is not taking blood thinners. I confirmed the patient's last platelet count with the nurse. A time-out was performed immediately prior to the procedure. Please see nursing documentation for vital signs. Sterile technique was used throughout the whole procedure. Once LOR achieved, the epidural catheter threaded easily without resistance. Aspiration of the catheter was negative for blood and CSF. The epidural was dosed slowly and an infusion was started.  1 attempt(s)Reason for block:procedure for pain

## 2023-02-20 ENCOUNTER — Encounter (HOSPITAL_COMMUNITY): Payer: Self-pay | Admitting: Obstetrics and Gynecology

## 2023-02-20 DIAGNOSIS — O99344 Other mental disorders complicating childbirth: Secondary | ICD-10-CM

## 2023-02-20 DIAGNOSIS — Z3A39 39 weeks gestation of pregnancy: Secondary | ICD-10-CM

## 2023-02-20 DIAGNOSIS — O134 Gestational [pregnancy-induced] hypertension without significant proteinuria, complicating childbirth: Secondary | ICD-10-CM

## 2023-02-20 LAB — RPR: RPR Ser Ql: NONREACTIVE

## 2023-02-20 MED ORDER — ONDANSETRON HCL 4 MG PO TABS: 4.0000 mg | ORAL_TABLET | ORAL | Status: DC | PRN

## 2023-02-20 MED ORDER — MEASLES, MUMPS & RUBELLA VAC IJ SOLR: 0.5000 mL | Freq: Once | INTRAMUSCULAR | Status: DC

## 2023-02-20 MED ORDER — OXYCODONE HCL 5 MG PO TABS: 5.0000 mg | ORAL_TABLET | ORAL | Status: DC | PRN

## 2023-02-20 MED ORDER — BENZOCAINE-MENTHOL 20-0.5 % EX AERO: 1.0000 | INHALATION_SPRAY | CUTANEOUS | Status: DC | PRN

## 2023-02-20 MED ORDER — DIBUCAINE (PERIANAL) 1 % EX OINT: 1.0000 | TOPICAL_OINTMENT | CUTANEOUS | Status: DC | PRN

## 2023-02-20 MED ORDER — IBUPROFEN 600 MG PO TABS
600.0000 mg | ORAL_TABLET | Freq: Four times a day (QID) | ORAL | Status: DC
  Administered 2023-02-20 – 2023-02-21 (×6): 600 mg via ORAL
  Filled 2023-02-20 (×6): qty 1

## 2023-02-20 MED ORDER — ACETAMINOPHEN 325 MG PO TABS
650.0000 mg | ORAL_TABLET | ORAL | Status: DC | PRN
  Administered 2023-02-20 (×2): 650 mg via ORAL
  Filled 2023-02-20 (×2): qty 2

## 2023-02-20 MED ORDER — ONDANSETRON HCL 4 MG/2ML IJ SOLN: 4.0000 mg | INTRAMUSCULAR | Status: DC | PRN

## 2023-02-20 MED ORDER — SIMETHICONE 80 MG PO CHEW: 80.0000 mg | CHEWABLE_TABLET | ORAL | Status: DC | PRN

## 2023-02-20 MED ORDER — TETANUS-DIPHTH-ACELL PERTUSSIS 5-2.5-18.5 LF-MCG/0.5 IM SUSY: 0.5000 mL | PREFILLED_SYRINGE | Freq: Once | INTRAMUSCULAR | Status: DC

## 2023-02-20 MED ORDER — COCONUT OIL OIL: 1.0000 | TOPICAL_OIL | Status: DC | PRN

## 2023-02-20 MED ORDER — WITCH HAZEL-GLYCERIN EX PADS: 1.0000 | MEDICATED_PAD | CUTANEOUS | Status: DC | PRN

## 2023-02-20 MED ORDER — FUROSEMIDE 20 MG PO TABS
20.0000 mg | ORAL_TABLET | Freq: Every day | ORAL | Status: DC
  Administered 2023-02-20 – 2023-02-21 (×2): 20 mg via ORAL
  Filled 2023-02-20 (×2): qty 1

## 2023-02-20 NOTE — Discharge Summary (Signed)
Postpartum Discharge Summary  Date of Service updated 02/21/2023     Patient Name: Linda Jackson DOB: 21-Jan-1991 MRN: [id number]  Date of admission: 02/19/2023 Delivery date:02/20/2023 Delivering provider: Cam Hai D Date of discharge: 02/21/2023  Admitting diagnosis: Gestational hypertension [O13.9] Intrauterine pregnancy: [redacted]w[redacted]d     Secondary diagnosis:  Principal Problem:   Gestational hypertension Active Problems:   Depression with anxiety   Smoker   History of drug use  Additional problems: none    Discharge diagnosis: Term Pregnancy Delivered and Gestational Hypertension                                              Post partum procedures: none Augmentation: AROM, Pitocin, Cytotec, and IP Foley Complications: None  Hospital course: Induction of Labor With Vaginal Delivery   32 y.o. yo [id number] at [redacted]w[redacted]d was admitted to the hospital 02/19/2023 for induction of labor.  Indication for induction: Gestational hypertension.  Patient had an uncomplicated labor course remarkable for having mild range BP elevations with neg pre-e labs. Neg UDS on admission. Membrane Rupture Time/Date: 11:38 PM,02/19/2023  Delivery Method:Vaginal, Spontaneous Operative Delivery:N/A Episiotomy: None Lacerations:  None Details of delivery can be found in separate delivery note.  Patient had a postpartum course complicated by receiving Lasix 20mg  x 5d without requiring antihypertensives. Patient is discharged home 02/21/23.  Newborn Data: Birth date:02/20/2023 Birth time:4:22 AM Gender:Female Living status:Living Apgars:8 ,9  Weight:3560 g (7lb 13.6oz)  Magnesium Sulfate received: No BMZ received: No Rhophylac:N/A MMR:N/A T-DaP:Given prenatally Flu: No Transfusion:No  Physical exam  Vitals:   02/20/23 1602 02/20/23 2020 02/21/23 0605 02/21/23 1302  BP: 115/74 119/65 108/68 128/67  Pulse: 90 94 86 97  Resp: 18 18 17 18   Temp: 98.8 F (37.1 C) 98.4 F (36.9 C) 98 F (36.7 C)  98.7 F (37.1 C)  TempSrc: Oral Oral Oral Oral  SpO2: 98% 99% 97% 98%  Weight:      Height:       General: alert and no distress Lochia: appropriate Uterine Fundus: firm Incision: N/A DVT Evaluation: No evidence of DVT seen on physical exam. Negative Homan's sign. Labs: Lab Results  Component Value Date   WBC 12.0 (H) 02/19/2023   HGB 12.3 02/19/2023   HCT 36.8 02/19/2023   MCV 94.8 02/19/2023   PLT 188 02/19/2023      Latest Ref Rng & Units 02/19/2023    3:43 PM  CMP  Glucose 70 - 99 mg/dL 865   BUN 6 - 20 mg/dL 6   Creatinine 7.84 - 6.96 mg/dL 2.95   Sodium 284 - 132 mmol/L 133   Potassium 3.5 - 5.1 mmol/L 3.2   Chloride 98 - 111 mmol/L 103   CO2 22 - 32 mmol/L 18   Calcium 8.9 - 10.3 mg/dL 8.7   Total Protein 6.5 - 8.1 g/dL 6.4   Total Bilirubin 0.3 - 1.2 mg/dL 0.2   Alkaline Phos 38 - 126 U/L 162   AST 15 - 41 U/L 23   ALT 0 - 44 U/L 13    Edinburgh Score:    02/20/2023    5:53 PM  Edinburgh Postnatal Depression Scale Screening Tool  I have been able to laugh and see the funny side of things. 0  I have looked forward with enjoyment to things. 0  I have blamed myself unnecessarily when  things went wrong. 1  I have been anxious or worried for no good reason. 2  I have felt scared or panicky for no good reason. 0  Things have been getting on top of me. 1  I have been so unhappy that I have had difficulty sleeping. 0  I have felt sad or miserable. 1  I have been so unhappy that I have been crying. 0  The thought of harming myself has occurred to me. 0  Edinburgh Postnatal Depression Scale Total 5     After visit meds:  Allergies as of 02/21/2023       Reactions   Morphine And Codeine Itching   "tried to claw my eyes out"   Cefzil [cefprozil] Hives   Lexapro [escitalopram Oxalate] Nausea And Vomiting   Red Dye #40 (allura Red)         Medication List     STOP taking these medications    aspirin EC 81 MG tablet       TAKE these medications     acetaminophen 500 MG tablet Commonly known as: TYLENOL Take 500 mg by mouth every 6 (six) hours as needed for headache. Pt reports taking 2 tablets today for HA   coconut oil Oil Apply 1 Application topically as needed.   furosemide 20 MG tablet Commonly known as: LASIX Take 1 tablet (20 mg total) by mouth daily for 5 days. Start taking on: February 22, 2023   ibuprofen 600 MG tablet Commonly known as: ADVIL Take 1 tablet (600 mg total) by mouth every 6 (six) hours.   PRENATAL VITAMIN PO Take by mouth.   traZODone 100 MG tablet Commonly known as: DESYREL Take 1 tablet (100 mg total) by mouth at bedtime as needed for sleep.         Discharge home in stable condition Infant Feeding: Bottle Infant Disposition:home with mother Discharge instruction: per After Visit Summary and Postpartum booklet. Activity: Advance as tolerated. Pelvic rest for 6 weeks.  Diet: routine diet Future Appointments: Future Appointments  Date Time Provider Department Center  03/04/2023 11:10 AM [facility] NURSE [facility]  04/02/2023  1:50 PM Arabella Merles, CNM [facility]   Follow up Visit:  Arabella Merles, CNM  Myrle Sheng R Please schedule this patient for Postpartum visit in: 6 weeks with the following provider: Any provider In-Person For C/S patients schedule nurse incision check in weeks 2 weeks: no High risk pregnancy complicated by: gHTN Delivery mode:  SVD Anticipated Birth Control:  IUD PP Procedures needed: RN BP check in 1-2wks Schedule Integrated BH visit: no  02/21/2023 Wyn Forster, MD

## 2023-02-21 ENCOUNTER — Other Ambulatory Visit (HOSPITAL_COMMUNITY): Payer: Self-pay

## 2023-02-21 MED ORDER — IBUPROFEN 600 MG PO TABS
600.0000 mg | ORAL_TABLET | Freq: Four times a day (QID) | ORAL | 0 refills | Status: DC
  Filled 2023-02-21: qty 30, 8d supply, fill #0

## 2023-02-21 MED ORDER — COCONUT OIL OIL: 1.0000 | TOPICAL_OIL | Status: DC | PRN

## 2023-02-21 MED ORDER — FUROSEMIDE 20 MG PO TABS
20.0000 mg | ORAL_TABLET | Freq: Every day | ORAL | 0 refills | Status: DC
  Filled 2023-02-21: qty 5, 5d supply, fill #0

## 2023-02-21 NOTE — Progress Notes (Signed)
Attending Circumcision Counseling Progress Note  Patient desires circumcision for her female infant.  Circumcision procedure details discussed, risks and benefits of procedure were also discussed.  These include but are not limited to: Benefits of circumcision in men include reduction in the rates of urinary tract infection (UTI), penile cancer, some sexually transmitted infections, penile inflammatory and retractile disorders, as well as easier hygiene.  Risks include bleeding , infection, injury of glans which may lead to penile deformity or urinary tract issues, unsatisfactory cosmetic appearance and other potential complications related to the procedure.  It was emphasized that this is an elective procedure.  Patient wants to proceed with circumcision; written informed consent obtained.  Will do circumcision soon, routine circumcision and post circumcision care ordered for the infant.  Roniyah Llorens L. Alysia Penna, M.D. 02/21/2023 2:05 PM

## 2023-02-21 NOTE — Anesthesia Postprocedure Evaluation (Signed)
Anesthesia Post Note  Patient: Linda Jackson  Procedure(s) Performed: AN AD HOC LABOR EPIDURAL     Patient location during evaluation: Mother Baby Anesthesia Type: Epidural Level of consciousness: awake, oriented and awake and alert Pain management: pain level controlled Vital Signs Assessment: post-procedure vital signs reviewed and stable Respiratory status: spontaneous breathing, respiratory function stable and nonlabored ventilation Cardiovascular status: stable Postop Assessment: no headache, adequate PO intake, patient able to bend at knees, able to ambulate and no apparent nausea or vomiting Anesthetic complications: no   No notable events documented.  Last Vitals:  Vitals:   02/20/23 2020 02/21/23 0605  BP: 119/65 108/68  Pulse: 94 86  Resp: 18 17  Temp: 36.9 C 36.7 C  SpO2: 99% 97%    Last Pain:  Vitals:   02/21/23 0605  TempSrc: Oral  PainSc: 3    Pain Goal:                   Zebulin Siegel

## 2023-02-21 NOTE — Clinical Social Work Maternal (Signed)
CLINICAL SOCIAL WORK MATERNAL/CHILD NOTE  Patient Details  Name: Linda Jackson MRN: [id number] Date of Birth: 12/08/1990  Date:  02/21/2023  Clinical Social Worker Initiating Note:  Enos Fling Date/Time: Initiated:  02/21/23/1352     Child's Name:  Linda Jackson   Biological Parents:  Mother, Father Linda Jackson 08/29/89  Linda Jackson 08-10-1977)   Need for Interpreter:  None   Reason for Referral:  Behavioral Health Concerns, Current Substance Use/Substance Use During Pregnancy     Address:  [address] [place] [address]    Phone number:  [phone] (home)     Additional phone number:   Household Members/Support Persons (HM/SP):   Household Member/Support Person 1   HM/SP Name Relationship DOB or Age  HM/SP -1 Linda Jackson FOB 08-10-1977  HM/SP -2        HM/SP -3        HM/SP -4        HM/SP -5        HM/SP -6        HM/SP -7        HM/SP -8          Natural Supports (not living in the home):  Spouse/significant other, Immediate Family   Professional Supports: None   Employment: Full-time   Type of Work: [employer]   Education:  High school graduate   Homebound arranged:    Surveyor, quantity Resources:  Medicaid   Other Resources:  Sales executive  , [employer]   Cultural/Religious Considerations Which May Impact Care:    Strengths:  Ability to meet basic needs  , Home prepared for child  , Understanding of illness, Pediatrician chosen   Psychotropic Medications:         Pediatrician:    [place]  Pediatrician List:   [facility] [place]    [place] [facility]  [facility]      Pediatrician Fax Number:    Risk Factors/Current Problems:  Mental Health Concerns  , Substance Use     Cognitive State:  Alert  , Able to Concentrate  , Insightful  , Goal Oriented  , Linear Thinking     Mood/Affect:  Interested  , Comfortable  , Relaxed  , Calm     CSW Assessment:  CSW received a consult Bipolar, Depression, History of drug use cocaine, crack and meth. CSW met MOB at bedside to complete a full psychosocial assessment. CSW entered the room, introduced herself and acknowledged that FOB was present. CSW asked MOB for privacy could FOB stepout for the assessment; MOB was agreeable and the FOB stepped out. CSW explained her role and the reason for the assessment. MOB presented as calm, was agreeable to consult and remained engaged throughout encounter.  CSW collected MOB's demographic information; and reported she does not have custody of her other 3 kids. Linda Jackson, Linda Jackson, and Linda Jackson they all have been adopted due to MOB's drug addiction and FOB's allegations of SA. CSW has made a CPS report to [facility] due to MOB's CPS history.  CSW inquired about MOB's mental health history. MOB reported being diagnosed with Bipolar 1 and depression as a teenager. MOB reported her bipolar as depressive and her last episode was in her "early 20's". MOB  reported she is currently prescribed trazodone 100mg  for support, that she will continue during her PP period; and will possibly lower the dose for  the safety of the infant. MOB reported coping strategies that included reading and crocheting. MOB reported currently feeling "good" and bonded well with the infant. CSW provided education regarding the baby blues period vs. perinatal mood disorders, discussed treatment and gave resources for mental health follow up if concerns arise.  CSW recommends self-evaluation during the postpartum time period using the New Mom Checklist from Postpartum Progress and encouraged MOB to contact a medical professional if symptoms are noted at any time. CSW assessed for safety with MOB SI/HI/DV;MOB denied all.  CSW asked MOB does she receive support resources; MOB said yes(WIC and food stamps). MOB reported having all essential items for the infant including a carseat,  bassinet and crib for safe sleeping. CSW provided review of Sudden Infant Death Syndrome (SIDS) precautions.  CSW informed MOB due to the substance use hx the hospital will perform a UDS and CDS on the infant. If the screenings return with a positive results a report to CPS will be made; MOB was understanding. MOB reported using cocaine 10 months ago and has not used any other substances since. MOB denied the support of medication like Subutex or suboxone to maintain her sobriety and the use of any other illicit drugs at this time.  [facility] intake worker Morrie Sheldon has screened out the report at this time  CSW will continue to monitor CDS results and a CPS if warranted.  CSW Plan/Description:  No Further Intervention Required/No Barriers to Discharge, Sudden Infant Death Syndrome (SIDS) Education, Perinatal Mood and Anxiety Disorder (PMADs) Education, Hospital Drug Screen Policy Information, Other Information/Referral to [facility], CSW Will Continue to Monitor Umbilical Cord Tissue Drug Screen Results and Make Report if Joanie Coddington, LCSW 02/21/2023, 1:57 PM

## 2023-02-26 ENCOUNTER — Encounter: Payer: Medicaid Other | Admitting: Advanced Practice Midwife

## 2023-03-04 ENCOUNTER — Telehealth: Payer: MEDICAID | Admitting: *Deleted

## 2023-03-04 VITALS — BP 112/82 | HR 86

## 2023-03-04 DIAGNOSIS — Z0130 Encounter for examination of blood pressure without abnormal findings: Secondary | ICD-10-CM

## 2023-03-04 DIAGNOSIS — Z8759 Personal history of other complications of pregnancy, childbirth and the puerperium: Secondary | ICD-10-CM

## 2023-03-04 NOTE — Progress Notes (Signed)
   NURSE VISIT- BLOOD PRESSURE CHECK  I connected with Linda Jackson on 03/04/2023 by MyChart video and verified that I am speaking with the correct person using two identifiers.   I discussed the limitations of evaluation and management by telemedicine. The patient expressed understanding and agreed to proceed.  Nurse is at the office, and patient is at home.  SUBJECTIVE:  Linda Jackson is a 32 y.o. [phone] female here for BP check. She is postpartum, delivery date 02/20/23     HYPERTENSION ROS:  Postpartum:  Severe headaches that don't go away with tylenol/other medicines: No  Visual changes (seeing spots/double/blurred vision) No  Severe pain under right breast breast or in center of upper chest No  Severe nausea/vomiting No  Taking medicines as instructed not applicable   OBJECTIVE:  BP 112/82 (BP Location: Left Arm, Patient Position: Sitting, Cuff Size: Normal)   Pulse 86   LMP 05/22/2022   Breastfeeding No   Appearance alert, well appearing, and in no distress.  ASSESSMENT: Postpartum  blood pressure check  PLAN: Discussed with Joellyn Haff, CNM, [facility]   Recommendations: no changes needed   Follow-up: as scheduled   Jobe Marker  03/04/2023 11:23 AM

## 2023-03-06 ENCOUNTER — Encounter: Payer: Self-pay | Admitting: Obstetrics & Gynecology

## 2023-03-06 ENCOUNTER — Telehealth: Payer: Self-pay

## 2023-03-06 NOTE — Telephone Encounter (Signed)
Patient called and wanted a note to clear her to go back to work.

## 2023-04-02 ENCOUNTER — Other Ambulatory Visit (HOSPITAL_COMMUNITY)
Admission: RE | Admit: 2023-04-02 | Discharge: 2023-04-02 | Disposition: A | Discharge status: Home / Self Care | Payer: MEDICAID | Source: Ambulatory Visit | Attending: Advanced Practice Midwife | Admitting: Advanced Practice Midwife

## 2023-04-02 ENCOUNTER — Encounter: Payer: Self-pay | Admitting: Advanced Practice Midwife

## 2023-04-02 ENCOUNTER — Ambulatory Visit: Payer: MEDICAID | Admitting: Advanced Practice Midwife

## 2023-04-02 DIAGNOSIS — Z3202 Encounter for pregnancy test, result negative: Secondary | ICD-10-CM | POA: Diagnosis not present

## 2023-04-02 DIAGNOSIS — Z124 Encounter for screening for malignant neoplasm of cervix: Secondary | ICD-10-CM

## 2023-04-02 DIAGNOSIS — Z30430 Encounter for insertion of intrauterine contraceptive device: Secondary | ICD-10-CM

## 2023-04-02 DIAGNOSIS — F53 Postpartum depression: Secondary | ICD-10-CM

## 2023-04-02 LAB — POCT URINE PREGNANCY: Preg Test, Ur: NEGATIVE

## 2023-04-02 MED ORDER — LEVONORGESTREL 20 MCG/DAY IU IUD
1.0000 | INTRAUTERINE_SYSTEM | Freq: Once | INTRAUTERINE | Status: AC
Start: 2023-04-02 — End: 2023-04-02
  Administered 2023-04-02: 1 via INTRAUTERINE

## 2023-04-02 MED ORDER — BUSPIRONE HCL 5 MG PO TABS: 5.0000 mg | ORAL_TABLET | Freq: Three times a day (TID) | ORAL | 3 refills | Status: DC

## 2023-04-02 NOTE — Patient Instructions (Signed)
Use the resource www.postpartum.net for helpful tips with postpartum depression.  Nothing in vagina for 3 days (no sex, douching, tampons, etc...) Check your strings once a month to make sure you can feel them, if you are not able to please let us know If you develop a fever of 100.4 or more in the next few weeks, or if you develop severe abdominal pain, please let [place] know Use a backup method of birth control, such as condoms, for 2 weeks

## 2023-04-02 NOTE — Progress Notes (Signed)
POSTPARTUM VISIT Patient name: Linda Jackson MRN [id number]  Date of birth: 10-Jul-1990 Chief Complaint:   Postpartum Care (Having post partum anxiety and depression, and would like birth control)  History of Present Illness:   Linda Jackson is a 32 y.o. [id number] Caucasian female being seen today for a postpartum visit. She is 6 weeks postpartum following a spontaneous vaginal delivery at 39.1 gestational weeks. IOL: yes, for gestational hypertension . Anesthesia: epidural.  Laceration: none.  Complications: none. Inpatient contraception: no.   Pregnancy complicated by new dx of gHTN at 39.0 with admission to hospital for IOL . Tobacco use: yes. 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 Last pap smear: states was nl in [facility] 2023    Patient's last menstrual period was 04/01/2023 (exact date).  Postpartum course has been complicated by worsening feelings of anxiety re her child being taken by CPS as she is living with the same family members who reported her previously . Bleeding less flow than a normal period. Bowel function is normal. Bladder function is normal. Urinary incontinence? no, fecal incontinence? no Patient is not sexually active. Last sexual activity: prior to birth of baby. Desired contraception: IUD. Patient does not know about a pregnancy in the future.  Desired family size is unsure number of children.   Upstream - 04/02/23 1334       Pregnancy Intention Screening   Does the patient want to become pregnant in the next year? No    Does the patient's partner want to become pregnant in the next year? No    Would the patient like to discuss contraceptive options today? Yes      Contraception Wrap Up   Current Method Abstinence    End Method IUD or IUS    Contraception Counseling Provided Yes            The pregnancy intention screening data noted above was reviewed. Potential methods of contraception were discussed. The patient elected to  proceed with IUD or IUS.  Edinburgh Postpartum Depression Screening: positive: H/O mental health disorder: yes anx/dep. Currently on meds: no.  Currently in therapy: no.  Sleeping: as expected.  Appetite: nl.  Still finds joy in things she used to: Yes.  Support at home: fiance.  SI/HI/II: no.  Interested in medicine: Yes.  Interested in therapy: Yes.  Edinburgh Postnatal Depression Scale - 04/02/23 1333       Edinburgh Postnatal Depression Scale:  In the Past 7 Days   I have been able to laugh and see the funny side of things. 1    I have looked forward with enjoyment to things. 1    I have blamed myself unnecessarily when things went wrong. 2    I have been anxious or worried for no good reason. 3    I have felt scared or panicky for no good reason. 3    Things have been getting on top of me. 2    I have been so unhappy that I have had difficulty sleeping. 1    I have felt sad or miserable. 2    I have been so unhappy that I have been crying. 1    The thought of harming myself has occurred to me. 0    Edinburgh Postnatal Depression Scale Total 16                08/19/2022    2:28 PM 07/04/2022    9:20 AM  GAD 7 : Generalized Anxiety Score  Nervous, Anxious, on Edge 0 2  Control/stop worrying 0 2  Worry too much - different things 0 2  Trouble relaxing 0 1  Restless 1 0  Easily annoyed or irritable 1 2  Afraid - awful might happen 0 0  Total GAD 7 Score 2 9     Baby's course has been uncomplicated. Baby is feeding by bottle. Infant has a pediatrician/family doctor? Yes.  Childcare strategy if returning to work/school: family.  Pt has material needs met for her and baby: Yes.   Review of Systems:   Pertinent items are noted in HPI Denies Abnormal vaginal discharge w/ itching/odor/irritation, headaches, visual changes, shortness of breath, chest pain, abdominal pain, severe nausea/vomiting, or problems with urination or bowel movements. Pertinent History Reviewed:  Reviewed  past medical,surgical, obstetrical and family history.  Reviewed problem list, medications and allergies. OB History  Gravida Para Term Preterm AB Living  4 4 4     4   SAB IAB Ectopic Multiple Live Births        0 4    # Outcome Date GA Lbr Len/2nd Weight Sex Type Anes PTL Lv  4 Term 02/20/23 [redacted]w[redacted]d / 01:25 7 lb 13.6 oz (3.56 kg) M Vag-Spont EPI  LIV     Birth Comments: WDL  3 Term 06/11/14 [redacted]w[redacted]d 27:21 / 00:27 9 lb 0.6 oz (4.1 kg) F Vag-Spont EPI N LIV  2 Term 09/28/12 [redacted]w[redacted]d  8 lb 7 oz (3.827 kg) F Vag-Spont EPI N LIV     Birth Comments: partial placental abruption  1 Term 11/21/11 [redacted]w[redacted]d  8 lb 3 oz (3.714 kg) F Vag-Spont EPI N LIV   Physical Assessment:   Vitals:   04/02/23 1331  BP: 121/84  Pulse: 73  Weight: 215 lb (97.5 kg)  Height: 5\' 1"  (1.549 m)  Body mass index is 40.62 kg/m.       Physical Examination:   General appearance: alert, well appearing, and in no physical distress, but tearful re anxious feelings  Mental status: alert, oriented to person, place, and time, anxious  Skin: warm & dry   Cardiovascular: normal heart rate noted   Respiratory: normal respiratory effort, no distress   Breasts: deferred, no complaints   Abdomen: soft, non-tender   Pelvic: normal external genitalia, vulva, vagina, cervix, uterus and adnexa. Thin prep pap obtained: Yes  Rectal: not examined  Extremities: Edema: none        Results for orders placed or performed in visit on 04/02/23 (from the past 24 hour(s))  POCT urine pregnancy   Collection Time: 04/02/23  1:44 PM  Result Value Ref Range   Preg Test, Ur Negative Negative     IUD INSERTION The risks and benefits of the method and placement have been thouroughly reviewed with the patient and all questions were answered.  Specifically the patient is aware of failure rate of 07/998, expulsion of the IUD and of possible perforation.  The patient is aware of irregular bleeding due to the method and understands the incidence of  irregular bleeding diminishes with time.  Signed copy of informed consent in chart.   Time out was performed.  A Graves speculum was placed in the vagina.  The cervix was visualized, prepped using Betadine, and grasped with a single tooth tenaculum. The uterus was found to be anteroflexed and it sounded to 6 cm.  Mirena  IUD placed per manufacturer's recommendations. The strings were trimmed to approximately 3 cm.  The patient tolerated the procedure well.   Informal transvaginal sonogram was performed and the proper placement of the IUD was verified.  Chaperone: Freddie Apley    Assessment & Plan:  1) Postpartum exam 2) Six wks s/p spontaneous vaginal delivery 3) bottle feeding 4) Depression screening> positive; rx Buspar 5mg  tid as states this has helped in the past; requests IBH referral as well as psychiatry in case additional meds are needed; lives with grandmother who reported her to CPS in the past and she is anxious at the thought of losing her son despite her being clean 5) Mirena IUD insertion The patient was given post procedure instructions, including signs and symptoms of infection and to check for the strings after each menses or each month, and refraining from intercourse or anything in the vagina for 3 days.  Condoms for 2 weeks. She was given a care card with date IUD placed, and date IUD to be removed.  She is scheduled for a f/u appointment in 4 weeks.  Essential components of care per ACOG recommendations:  1.  Mood and well being:  If positive depression screen, discussed and plan developed.  If using tobacco we discussed reduction/cessation and risk of relapse If current substance abuse, we discussed and referral to local resources was offered.   2. Infant care and feeding:  If breastfeeding, discussed returning to work, pumping, breastfeeding-associated pain, guidance regarding return to fertility while lactating if not using another method. If needed, patient was  provided with a letter to be allowed to pump q 2-3hrs to support lactation in a private location with access to a refrigerator to store breastmilk.   Recommended that all caregivers be immunized for flu, pertussis and other preventable communicable diseases If pt does not have material needs met for her/baby, referred to local resources for help obtaining these.  3. Sexuality, contraception and birth spacing Provided guidance regarding sexuality, management of dyspareunia, and resumption of intercourse Discussed avoiding interpregnancy interval <73mths and recommended birth spacing of 18 months  4. Sleep and fatigue Discussed coping options for fatigue and sleep disruption Encouraged family/partner/community support of 4 hrs of uninterrupted sleep to help with mood and fatigue  5. Physical recovery  If pt had a C/S, assessed incisional pain and providing guidance on normal vs prolonged recovery If pt had a laceration, perineal healing and pain reviewed.  If urinary or fecal incontinence, discussed management and referred to PT or uro/gyn if indicated  Patient is safe to resume physical activity. Discussed attainment of healthy weight.  6.  Chronic disease management Discussed pregnancy complications if any, and their implications for future childbearing and long-term maternal health. Review recommendations for prevention of recurrent pregnancy complications, such as 17 hydroxyprogesterone caproate to reduce risk for recurrent PTB not applicable, or aspirin to reduce risk of preeclampsia yes. Pt had GDM: No. If yes, 2hr GTT scheduled: not applicable. Reviewed medications and non-pregnant dosing including consideration of whether pt is breastfeeding using a reliable resource such as LactMed: not applicable Referred for f/u w/ PCP or subspecialist providers as indicated: not applicable  7. Health maintenance Mammogram at 32yo or earlier if indicated Pap smears as indicated  Meds:  Meds  ordered this encounter  Medications   levonorgestrel (MIRENA) 20 MCG/DAY IUD 1 each   busPIRone (BUSPAR) 5 MG tablet    Sig: Take 1 tablet (5 mg total) by mouth 3 (three) times daily.    Dispense:  150 tablet    Refill:  3  Order Specific Question:   Supervising Provider    Answer:   Myna Hidalgo [[id number]]    Follow-up: Return in about 4 weeks (around 04/30/2023) for f/u IUD.   Orders Placed This Encounter  Procedures   Ambulatory referral to Integrated Behavioral Health   Ambulatory referral to Psychiatry   POCT urine pregnancy    Arabella Merles [facility] 04/02/2023 4:40 PM

## 2023-04-07 ENCOUNTER — Telehealth: Payer: Self-pay | Admitting: Clinical

## 2023-04-07 NOTE — Telephone Encounter (Signed)
Attempt call regarding referral; Unable to leave message as voicemail has not been set up.

## 2023-04-14 LAB — CYTOLOGY - PAP
Comment: NEGATIVE
Diagnosis: NEGATIVE
High risk HPV: NEGATIVE

## 2023-04-14 NOTE — BH Specialist Note (Signed)
Integrated Behavioral Health via Telemedicine Visit  04/28/2023 [name] [id number]  Number of Integrated Behavioral Health Clinician visits: 1- Initial Visit  Session Start time: 1315   Session End time: 1419  Total time in minutes: 64    Referring Provider: Shawna Clamp, CNM Patient/Family location: [facility] Provider location: [facility]  All persons participating in visit: [name] and [facility] Linda Jackson   Types of Service: Individual psychotherapy and Video visit  I connected with Linda Jackson and/or Linda Jackson [facility]'s  n/a  via  Telephone or Video Enabled Telemedicine Application  (Video is Caregility application) and verified that I am speaking with the correct person using two identifiers. Discussed confidentiality: Yes   I discussed the limitations of telemedicine and the availability of in person appointments.  Discussed there is a possibility of technology failure and discussed alternative modes of communication if that failure occurs.  I discussed that engaging in this telemedicine visit, they consent to the provision of behavioral healthcare and the services will be billed under their insurance.  Patient and/or legal guardian expressed understanding and consented to Telemedicine visit: Yes    Presenting Concerns: Patient and/or family reports the following symptoms/concerns: Increased anxiety, panic, depression; attributes to losses and life stress the past five years (painful choice of adoption for older children to give them a better life, loss of both parents, incarceration), followed by healthy life changes with current fiance; current caregiver of grandmother, after grandfather passed; adjusting to new motherhood while being shamed by family over past mistakes.  Duration of problem: Ongoing; Severity of problem: severe  Patient and/or Family's Strengths/Protective Factors: Social  connections, Concrete supports in place (healthy food, safe environments, etc.), and Sense of purpose  Goals Addressed: Patient will:  Reduce symptoms of: anxiety, depression, mood instability, and stress   Increase knowledge and/or ability of: stress reduction   Demonstrate ability to: Increase healthy adjustment to current life circumstances, Increase adequate support systems for patient/family, and Increase motivation to adhere to plan of care  Progress towards Goals: Ongoing  Interventions: Interventions utilized:  Motivational Interviewing, Psychoeducation and/or Health Education, Link to [employer], and Supportive Reflection Standardized Assessments completed: GAD-7 and PHQ 9  Patient and/or Family Response: Patient agrees with treatment plan.    Assessment: Patient currently experiencing Bipolar 1 disorder (as previously diagnosed); Caregiver stress  Patient may benefit from psychoeducation and brief therapeutic interventions regarding coping with symptoms of depression, anxiety with panic, life stress   Plan: Follow up with behavioral health clinician on :  Behavioral recommendations:  -Continue taking Buspar as prescribed; continue plan to discuss BH medication with psychiatry at upcoming appointment 05/01/23 -Continue using music for healthy self-coping daily; consider adding "invisible cape/poison arrow" strategy as needed to help repel negative messages from others -Consider resources for additional support on After Visit Summary (for caregivers; for new mom/parents) Referral(s): Integrated Art gallery manager (In Clinic) and [employer] Resources:  Caregiver support; New mom support  I discussed the assessment and treatment plan with the patient and/or parent/guardian. They were provided an opportunity to ask questions and all were answered. They agreed with the plan and demonstrated an understanding of the instructions.   They were advised to call back  or seek an in-person evaluation if the symptoms worsen or if the condition fails to improve as anticipated.  [name], LCSW     04/28/2023    1:39 PM 12/04/2022    9:56  AM 08/19/2022    2:27 PM 07/04/2022    9:20 AM  Depression screen PHQ 2/9  Decreased Interest 1 1 0 0  Down, Depressed, Hopeless 1 2 0 1  PHQ - 2 Score 2 3 0 1  Altered sleeping 0 1 2 2   Tired, decreased energy 3 2 1 2   Change in appetite 2 0 1 0  Feeling bad or failure about yourself  3 2 0 0  Trouble concentrating 0 2 0 0  Moving slowly or fidgety/restless 3 0 0 0  Suicidal thoughts 0 0 0 0  PHQ-9 Score 13 10 4 5   Difficult doing work/chores  Somewhat difficult        04/28/2023    1:43 PM 08/19/2022    2:28 PM 07/04/2022    9:20 AM  GAD 7 : Generalized Anxiety Score  Nervous, Anxious, on Edge 3 0 2  Control/stop worrying 3 0 2  Worry too much - different things 3 0 2  Trouble relaxing 3 0 1  Restless 2 1 0  Easily annoyed or irritable 3 1 2   Afraid - awful might happen 3 0 0  Total GAD 7 Score 20 2 9

## 2023-04-28 ENCOUNTER — Ambulatory Visit (INDEPENDENT_AMBULATORY_CARE_PROVIDER_SITE_OTHER): Payer: MEDICAID

## 2023-04-28 DIAGNOSIS — Z636 Dependent relative needing care at home: Secondary | ICD-10-CM

## 2023-04-28 DIAGNOSIS — F319 Bipolar disorder, unspecified: Secondary | ICD-10-CM | POA: Diagnosis not present

## 2023-04-28 NOTE — Patient Instructions (Signed)
[facility] for [facility] [address] [place], [place] [address] [phone] (main office) [phone] (Shama Monfils's office)  [facility] (Caregiver Support) [email or web address]  New Parent Support Groups [email or web address]

## 2023-04-29 ENCOUNTER — Telehealth (HOSPITAL_COMMUNITY): Payer: Self-pay

## 2023-04-29 NOTE — Telephone Encounter (Signed)
05/01/23 appt confirmed

## 2023-04-30 ENCOUNTER — Ambulatory Visit: Payer: MEDICAID | Admitting: Advanced Practice Midwife

## 2023-04-30 ENCOUNTER — Encounter: Payer: Self-pay | Admitting: Advanced Practice Midwife

## 2023-04-30 VITALS — BP 123/81 | HR 67 | Ht 61.0 in | Wt 223.0 lb

## 2023-04-30 DIAGNOSIS — F418 Other specified anxiety disorders: Secondary | ICD-10-CM | POA: Diagnosis not present

## 2023-04-30 DIAGNOSIS — Z30431 Encounter for routine checking of intrauterine contraceptive device: Secondary | ICD-10-CM | POA: Diagnosis not present

## 2023-04-30 DIAGNOSIS — Z975 Presence of (intrauterine) contraceptive device: Secondary | ICD-10-CM | POA: Insufficient documentation

## 2023-04-30 NOTE — Progress Notes (Signed)
GYN VISIT Patient name: Linda Jackson MRN [id number]  Date of birth: May 02, 1991 Chief Complaint:   IUD check  History of Present Illness:   Linda Jackson is a 32 y.o. [phone] Caucasian female being seen today for f/u of IUD placement and f/u from restart of Buspar.    Patient's last menstrual period was 04/01/2023 (exact date). The current method of family planning is IUD.  Last pap Sept 2024. Results were: NILM w/ HRHPV negative     04/28/2023    1:39 PM 12/04/2022    9:56 AM 08/19/2022    2:27 PM 07/04/2022    9:20 AM  Depression screen PHQ 2/9  Decreased Interest 1 1 0 0  Down, Depressed, Hopeless 1 2 0 1  PHQ - 2 Score 2 3 0 1  Altered sleeping 0 1 2 2   Tired, decreased energy 3 2 1 2   Change in appetite 2 0 1 0  Feeling bad or failure about yourself  3 2 0 0  Trouble concentrating 0 2 0 0  Moving slowly or fidgety/restless 3 0 0 0  Suicidal thoughts 0 0 0 0  PHQ-9 Score 13 10 4 5   Difficult doing work/chores  Somewhat difficult          04/28/2023    1:43 PM 08/19/2022    2:28 PM 07/04/2022    9:20 AM  GAD 7 : Generalized Anxiety Score  Nervous, Anxious, on Edge 3 0 2  Control/stop worrying 3 0 2  Worry too much - different things 3 0 2  Trouble relaxing 3 0 1  Restless 2 1 0  Easily annoyed or irritable 3 1 2   Afraid - awful might happen 3 0 0  Total GAD 7 Score 20 2 9      Review of Systems:   Pertinent items are noted in HPI Denies fever/chills, dizziness, headaches, visual disturbances, fatigue, shortness of breath, chest pain, abdominal pain, vomiting, abnormal vaginal discharge/itching/odor/irritation, problems with periods, bowel movements, urination, or intercourse unless otherwise stated above.  Pertinent History Reviewed:  Reviewed past medical,surgical, social, obstetrical and family history.  Reviewed problem list, medications and allergies. Physical Assessment:   Vitals:   04/30/23 1337  BP: 123/81  Pulse: 67  Weight: 223 lb (101.2  kg)  Height: 5\' 1"  (1.549 m)  Body mass index is 42.14 kg/m.       Physical Examination:   General appearance: alert, well appearing, and in no distress  Mental status: alert, oriented to person, place, and time  Skin: warm & dry   Cardiovascular: normal heart rate noted  Respiratory: normal respiratory effort, no distress  Abdomen: soft, non-tender   Pelvic: normal external genitalia, vulva, vagina, cervix, uterus and adnexa; difficult to visualize os/strings due to collapsing sidewalls; transvag u/s by KB showing fundal IUD placement  Extremities: no edema   No results found for this or any previous visit (from the past 24 hour(s)).  Assessment & Plan:  1) IUD placement> verified with transvag u/s  2) Depression/anxiety> stable on Buspar 5mg  tid; has psychiatrist visit virtually tomorrow and has been meeting with Asher Muir; feels situation is at least stable if not a little better (less feelings of panic/fear)  Meds: No orders of the defined types were placed in this encounter.   No orders of the defined types were placed in this encounter.   Return for prn.  Arabella Merles CNM 04/30/2023 2:31 PM

## 2023-05-01 ENCOUNTER — Ambulatory Visit (HOSPITAL_COMMUNITY): Payer: MEDICAID | Admitting: Psychiatry

## 2023-05-05 NOTE — BH Specialist Note (Signed)
Integrated Behavioral Health via Telemedicine Visit  05/05/2023 Linda Jackson [id number]  Number of Integrated Behavioral Health Clinician visits: 1- Initial Visit  Session Start time: 1315   Session End time: 1419  Total time in minutes: 64   Referring Provider: *** Patient/Family location: [facility] Provider location: *** All persons participating in visit: *** Types of Service: {CHL AMB TYPE OF [phone]}  I connected with [name] and/or [name] {family members:20773} via  Telephone or Video Enabled Telemedicine Application  (Video is Caregility application) and verified that I am speaking with the correct person using two identifiers. Discussed confidentiality: {YES/NO:21197}  I discussed the limitations of telemedicine and the availability of in person appointments.  Discussed there is a possibility of technology failure and discussed alternative modes of communication if that failure occurs.  I discussed that engaging in this telemedicine visit, they consent to the provision of behavioral healthcare and the services will be billed under their insurance.  Patient and/or legal guardian expressed understanding and consented to Telemedicine visit: {YES/NO:21197}  Presenting Concerns: Patient and/or family reports the following symptoms/concerns: *** Duration of problem: ***; Severity of problem: {Mild/Moderate/Severe:20260}  Patient and/or Family's Strengths/Protective Factors: {CHL AMB BH PROTECTIVE FACTORS:[phone]}  Goals Addressed: Patient will:  Reduce symptoms of: {IBH Symptoms:21014056}   Increase knowledge and/or ability of: {IBH Patient Tools:21014057}   Demonstrate ability to: {IBH Goals:21014053}  Progress towards Goals: {CHL AMB BH PROGRESS TOWARDS GOALS:[phone]}  Interventions: Interventions utilized:  {IBH Interventions:21014054} Standardized Assessments completed: {IBH Screening Tools:21014051}  Patient and/or  Family Response: ***  Assessment: Patient currently experiencing ***.   Patient may benefit from ***.  Plan: Follow up with behavioral health clinician on : *** Behavioral recommendations: *** Referral(s): {IBH Referrals:21014055}  I discussed the assessment and treatment plan with the patient and/or parent/guardian. They were provided an opportunity to ask questions and all were answered. They agreed with the plan and demonstrated an understanding of the instructions.   They were advised to call back or seek an in-person evaluation if the symptoms worsen or if the condition fails to improve as anticipated.  Valetta Close Courtny Bennison, LCSW

## 2023-05-19 ENCOUNTER — Ambulatory Visit: Payer: MEDICAID | Admitting: Clinical

## 2023-05-19 DIAGNOSIS — Z91199 Patient's noncompliance with other medical treatment and regimen due to unspecified reason: Secondary | ICD-10-CM

## 2023-05-23 ENCOUNTER — Telehealth (HOSPITAL_COMMUNITY): Payer: Self-pay

## 2023-05-23 NOTE — Telephone Encounter (Signed)
called to confirm 05/27/23 appt no answer no vm 05/23/23 jj

## 2023-05-26 NOTE — Telephone Encounter (Signed)
Appt confirmed by pt.

## 2023-05-27 ENCOUNTER — Encounter (HOSPITAL_COMMUNITY): Payer: Self-pay | Admitting: Psychiatry

## 2023-05-27 ENCOUNTER — Ambulatory Visit (HOSPITAL_COMMUNITY): Payer: MEDICAID | Admitting: Psychiatry

## 2023-05-27 DIAGNOSIS — Z758 Other problems related to medical facilities and other health care: Secondary | ICD-10-CM

## 2023-05-27 DIAGNOSIS — Z87820 Personal history of traumatic brain injury: Secondary | ICD-10-CM | POA: Diagnosis not present

## 2023-05-27 DIAGNOSIS — Z720 Tobacco use: Secondary | ICD-10-CM | POA: Insufficient documentation

## 2023-05-27 DIAGNOSIS — F411 Generalized anxiety disorder: Secondary | ICD-10-CM | POA: Diagnosis not present

## 2023-05-27 DIAGNOSIS — Z6981 Encounter for mental health services for victim of other abuse: Secondary | ICD-10-CM

## 2023-05-27 DIAGNOSIS — F4310 Post-traumatic stress disorder, unspecified: Secondary | ICD-10-CM

## 2023-05-27 DIAGNOSIS — F333 Major depressive disorder, recurrent, severe with psychotic symptoms: Secondary | ICD-10-CM

## 2023-05-27 DIAGNOSIS — F1991 Other psychoactive substance use, unspecified, in remission: Secondary | ICD-10-CM

## 2023-05-27 DIAGNOSIS — F4001 Agoraphobia with panic disorder: Secondary | ICD-10-CM | POA: Diagnosis not present

## 2023-05-27 DIAGNOSIS — F17200 Nicotine dependence, unspecified, uncomplicated: Secondary | ICD-10-CM

## 2023-05-27 MED ORDER — NALTREXONE HCL 50 MG PO TABS: 50.0000 mg | ORAL_TABLET | Freq: Every day | ORAL | 2 refills | Status: DC

## 2023-05-27 MED ORDER — VENLAFAXINE HCL ER 37.5 MG PO CP24: 37.5000 mg | ORAL_CAPSULE | Freq: Every day | ORAL | 1 refills | Status: DC
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
## 2023-05-27 NOTE — Patient Instructions (Addendum)
We restarted the venlafaxine XR (Effexor XR) at 37.5 mg once daily with breakfast. 1 week after starting Effexor XR start naltrexone 25 mg (half a tablet) daily for 3 days and if tolerating increase to 50 mg daily thereafter and that should help with the cravings.  Start cutting back on the caffeine you are consuming daily starting with the caffeine consumed closest to bedtime.  In order to avoid the caffeine withdrawal headaches decrease by half a unit (unit is whenever the caffeine is coming in a can, bottle, cup etc.) every 5 days until you are consuming no more than 1 caffeinated beverage first thing in the morning.  If you begin to have a caffeine withdrawal headaches you can do sips until the headache goes away but do not go back to the old amount.  You can get free resources for smoking cessation by going to the quit Line provided here: http://skinner-smith.org/  There is a high likelihood that you have borderline personality disorder and the best treatment for this is dialectical behavioral therapy (DBT) and you can find the free workbook at this http://lopez-wang.org/.pdf  Please reach out to [facility] to get another follow-up for therapy.  Please connect with your son's doctor in order to get established with your own primary care provider.  When you do, and it is financially feasible, you may want to ask them for a nutrition referral, vitamin D, B12, folate, iron panel and consider a sleep study to make sure you do not have sleep apnea with the snoring.

## 2023-06-26 ENCOUNTER — Telehealth (HOSPITAL_COMMUNITY): Payer: MEDICAID | Admitting: Psychiatry

## 2023-07-03 ENCOUNTER — Telehealth (HOSPITAL_COMMUNITY): Payer: MEDICAID | Admitting: Psychiatry

## 2023-07-17 ENCOUNTER — Telehealth (INDEPENDENT_AMBULATORY_CARE_PROVIDER_SITE_OTHER): Payer: MEDICAID | Admitting: Psychiatry

## 2023-07-17 ENCOUNTER — Encounter (HOSPITAL_COMMUNITY): Payer: Self-pay | Admitting: Psychiatry

## 2023-07-17 DIAGNOSIS — F17200 Nicotine dependence, unspecified, uncomplicated: Secondary | ICD-10-CM

## 2023-07-17 DIAGNOSIS — Z758 Other problems related to medical facilities and other health care: Secondary | ICD-10-CM

## 2023-07-17 DIAGNOSIS — F4001 Agoraphobia with panic disorder: Secondary | ICD-10-CM

## 2023-07-17 DIAGNOSIS — F411 Generalized anxiety disorder: Secondary | ICD-10-CM

## 2023-07-17 DIAGNOSIS — Z789 Other specified health status: Secondary | ICD-10-CM | POA: Insufficient documentation

## 2023-07-17 DIAGNOSIS — F333 Major depressive disorder, recurrent, severe with psychotic symptoms: Secondary | ICD-10-CM | POA: Diagnosis not present

## 2023-07-17 DIAGNOSIS — F15982 Other stimulant use, unspecified with stimulant-induced sleep disorder: Secondary | ICD-10-CM | POA: Insufficient documentation

## 2023-07-17 DIAGNOSIS — F4310 Post-traumatic stress disorder, unspecified: Secondary | ICD-10-CM | POA: Diagnosis not present

## 2023-07-17 DIAGNOSIS — Z720 Tobacco use: Secondary | ICD-10-CM

## 2023-07-17 DIAGNOSIS — F1991 Other psychoactive substance use, unspecified, in remission: Secondary | ICD-10-CM

## 2023-07-17 MED ORDER — VENLAFAXINE HCL ER 75 MG PO CP24
75.0000 mg | ORAL_CAPSULE | Freq: Every day | ORAL | 0 refills | Status: DC
Start: 2023-07-17 — End: 2023-09-29

## 2023-07-17 MED ORDER — NALTREXONE HCL 50 MG PO TABS
50.0000 mg | ORAL_TABLET | Freq: Every day | ORAL | 0 refills | Status: DC
Start: 2023-07-17 — End: 2023-09-01

## 2023-07-17 MED ORDER — BUSPIRONE HCL 5 MG PO TABS
5.0000 mg | ORAL_TABLET | Freq: Three times a day (TID) | ORAL | 2 refills | Status: DC
Start: 2023-07-17 — End: 2023-11-25

## 2023-07-17 NOTE — Patient Instructions (Signed)
 We increased the venlafaxine  XR to 75 mg once daily with breakfast today.  Keep up the good work with cutting back on caffeine  and I think you will continue to see improvements with your level of anxiety and panic attacks and insomnia.  When you get a chance try to establish care with a primary care provider like you have been planning.
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
## 2023-08-07 ENCOUNTER — Encounter (HOSPITAL_COMMUNITY): Payer: Self-pay

## 2023-09-01 ENCOUNTER — Telehealth (INDEPENDENT_AMBULATORY_CARE_PROVIDER_SITE_OTHER): Payer: MEDICAID | Admitting: Psychiatry

## 2023-09-01 ENCOUNTER — Encounter (HOSPITAL_COMMUNITY): Payer: Self-pay | Admitting: Psychiatry

## 2023-09-01 DIAGNOSIS — F1991 Other psychoactive substance use, unspecified, in remission: Secondary | ICD-10-CM

## 2023-09-01 DIAGNOSIS — F4310 Post-traumatic stress disorder, unspecified: Secondary | ICD-10-CM

## 2023-09-01 DIAGNOSIS — F4001 Agoraphobia with panic disorder: Secondary | ICD-10-CM | POA: Diagnosis not present

## 2023-09-01 DIAGNOSIS — F333 Major depressive disorder, recurrent, severe with psychotic symptoms: Secondary | ICD-10-CM | POA: Diagnosis not present

## 2023-09-01 DIAGNOSIS — Z758 Other problems related to medical facilities and other health care: Secondary | ICD-10-CM

## 2023-09-01 DIAGNOSIS — Z789 Other specified health status: Secondary | ICD-10-CM

## 2023-09-01 DIAGNOSIS — F15982 Other stimulant use, unspecified with stimulant-induced sleep disorder: Secondary | ICD-10-CM

## 2023-09-01 DIAGNOSIS — F411 Generalized anxiety disorder: Secondary | ICD-10-CM

## 2023-09-01 MED ORDER — LAMOTRIGINE 25 MG PO TABS: ORAL_TABLET | ORAL | 0 refills | Status: DC

## 2023-09-01 MED ORDER — NALTREXONE HCL 50 MG PO TABS
50.0000 mg | ORAL_TABLET | Freq: Every day | ORAL | 1 refills | Status: DC
Start: 2023-09-01 — End: 2023-11-25

## 2023-09-01 MED ORDER — LAMOTRIGINE 100 MG PO TABS
100.0000 mg | ORAL_TABLET | Freq: Every evening | ORAL | 2 refills | Status: DC
Start: 2023-09-01 — End: 2023-10-27
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
## 2023-09-01 NOTE — Patient Instructions (Addendum)
 As we discussed you can call this number for the [facility] that I share a building with: [phone].  It would still be a good idea to get an updated EKG when you see them and they should be able to assist with getting a sleep study as well.  Since we do not have an updated EKG we will hold off on restarting Abilify and instead try lamotrigine (Lamictal) 25 mg nightly for 2 weeks.  Then you can increase to 50 mg nightly for 2 weeks.  The 100 mg nightly dose should then be available a few days before you complete the 50 mg dose.  If you have a rash of any kind please contact me immediately and stop the medication.  We otherwise kept your medications the same.  Keep making the slow and steady progress with cutting back on caffeine and you can reach out to the quit Line to have the mail you resources for free; just be sure to take off the nicotine patch before bedtime: [email or web address]

## 2023-09-09 ENCOUNTER — Telehealth (HOSPITAL_COMMUNITY): Payer: MEDICAID | Admitting: Psychiatry

## 2023-09-23 ENCOUNTER — Other Ambulatory Visit (HOSPITAL_COMMUNITY): Payer: Self-pay | Admitting: Psychiatry

## 2023-09-23 DIAGNOSIS — F333 Major depressive disorder, recurrent, severe with psychotic symptoms: Secondary | ICD-10-CM

## 2023-09-24 ENCOUNTER — Encounter (HOSPITAL_COMMUNITY): Payer: Self-pay

## 2023-09-29 ENCOUNTER — Telehealth (INDEPENDENT_AMBULATORY_CARE_PROVIDER_SITE_OTHER): Payer: MEDICAID | Admitting: Psychiatry

## 2023-09-29 ENCOUNTER — Encounter (HOSPITAL_COMMUNITY): Payer: Self-pay | Admitting: Psychiatry

## 2023-09-29 DIAGNOSIS — F4310 Post-traumatic stress disorder, unspecified: Secondary | ICD-10-CM

## 2023-09-29 DIAGNOSIS — Z789 Other specified health status: Secondary | ICD-10-CM

## 2023-09-29 DIAGNOSIS — Z758 Other problems related to medical facilities and other health care: Secondary | ICD-10-CM

## 2023-09-29 DIAGNOSIS — F411 Generalized anxiety disorder: Secondary | ICD-10-CM

## 2023-09-29 DIAGNOSIS — F17200 Nicotine dependence, unspecified, uncomplicated: Secondary | ICD-10-CM

## 2023-09-29 DIAGNOSIS — F15982 Other stimulant use, unspecified with stimulant-induced sleep disorder: Secondary | ICD-10-CM

## 2023-09-29 DIAGNOSIS — F333 Major depressive disorder, recurrent, severe with psychotic symptoms: Secondary | ICD-10-CM | POA: Diagnosis not present

## 2023-09-29 DIAGNOSIS — F4001 Agoraphobia with panic disorder: Secondary | ICD-10-CM

## 2023-09-29 DIAGNOSIS — Z720 Tobacco use: Secondary | ICD-10-CM

## 2023-09-29 MED ORDER — VENLAFAXINE HCL ER 150 MG PO CP24
150.0000 mg | ORAL_CAPSULE | Freq: Every day | ORAL | 2 refills | Status: DC
Start: 2023-09-29 — End: 2023-11-25
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
## 2023-09-29 NOTE — Patient Instructions (Signed)
 We increased venlafaxine XR to 150 mg once daily today.  This should begin to help with some of the baseline anxiety though the situation at work is particularly stressful and I am sorry that is taking place.  We may go up on the dose of the lamotrigine at next appointment.

## 2023-10-27 ENCOUNTER — Encounter (HOSPITAL_COMMUNITY): Payer: Self-pay | Admitting: Psychiatry

## 2023-10-27 ENCOUNTER — Telehealth (INDEPENDENT_AMBULATORY_CARE_PROVIDER_SITE_OTHER): Payer: MEDICAID | Admitting: Psychiatry

## 2023-10-27 DIAGNOSIS — F4001 Agoraphobia with panic disorder: Secondary | ICD-10-CM | POA: Diagnosis not present

## 2023-10-27 DIAGNOSIS — Z758 Other problems related to medical facilities and other health care: Secondary | ICD-10-CM

## 2023-10-27 DIAGNOSIS — F15982 Other stimulant use, unspecified with stimulant-induced sleep disorder: Secondary | ICD-10-CM

## 2023-10-27 DIAGNOSIS — F333 Major depressive disorder, recurrent, severe with psychotic symptoms: Secondary | ICD-10-CM | POA: Diagnosis not present

## 2023-10-27 DIAGNOSIS — Z720 Tobacco use: Secondary | ICD-10-CM

## 2023-10-27 DIAGNOSIS — F411 Generalized anxiety disorder: Secondary | ICD-10-CM

## 2023-10-27 DIAGNOSIS — Z789 Other specified health status: Secondary | ICD-10-CM

## 2023-10-27 DIAGNOSIS — F17200 Nicotine dependence, unspecified, uncomplicated: Secondary | ICD-10-CM

## 2023-10-27 DIAGNOSIS — F4310 Post-traumatic stress disorder, unspecified: Secondary | ICD-10-CM

## 2023-10-27 DIAGNOSIS — Z87820 Personal history of traumatic brain injury: Secondary | ICD-10-CM

## 2023-10-27 MED ORDER — LAMOTRIGINE 200 MG PO TABS
200.0000 mg | ORAL_TABLET | Freq: Every day | ORAL | 5 refills | Status: DC
Start: 2023-11-10 — End: 2023-11-25
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
## 2023-10-27 NOTE — Patient Instructions (Signed)
 We crease the lamotrigine  (Lamictal ) to 150 mg (1-1/2 tablets of the 100 mg strength) daily for the next 2 weeks.  Once you have completed this you can increase to the 200 mg dose which should be available as a new prescription at the pharmacy.

## 2023-11-25 ENCOUNTER — Telehealth (INDEPENDENT_AMBULATORY_CARE_PROVIDER_SITE_OTHER): Payer: MEDICAID | Admitting: Psychiatry

## 2023-11-25 ENCOUNTER — Encounter (HOSPITAL_COMMUNITY): Payer: Self-pay | Admitting: Psychiatry

## 2023-11-25 DIAGNOSIS — F4310 Post-traumatic stress disorder, unspecified: Secondary | ICD-10-CM

## 2023-11-25 DIAGNOSIS — F4001 Agoraphobia with panic disorder: Secondary | ICD-10-CM

## 2023-11-25 DIAGNOSIS — F411 Generalized anxiety disorder: Secondary | ICD-10-CM

## 2023-11-25 DIAGNOSIS — F1991 Other psychoactive substance use, unspecified, in remission: Secondary | ICD-10-CM | POA: Diagnosis not present

## 2023-11-25 DIAGNOSIS — F17200 Nicotine dependence, unspecified, uncomplicated: Secondary | ICD-10-CM

## 2023-11-25 DIAGNOSIS — Z758 Other problems related to medical facilities and other health care: Secondary | ICD-10-CM

## 2023-11-25 DIAGNOSIS — Z720 Tobacco use: Secondary | ICD-10-CM

## 2023-11-25 DIAGNOSIS — F332 Major depressive disorder, recurrent severe without psychotic features: Secondary | ICD-10-CM | POA: Diagnosis not present

## 2023-11-25 MED ORDER — LAMOTRIGINE 200 MG PO TABS: 200.0000 mg | ORAL_TABLET | Freq: Every day | ORAL | 1 refills | Status: AC

## 2023-11-25 MED ORDER — NALTREXONE HCL 50 MG PO TABS
50.0000 mg | ORAL_TABLET | Freq: Every day | ORAL | 1 refills | Status: AC
Start: 2023-11-25 — End: ?

## 2023-11-25 MED ORDER — VENLAFAXINE HCL ER 150 MG PO CP24
150.0000 mg | ORAL_CAPSULE | Freq: Every day | ORAL | 1 refills | Status: AC
Start: 2023-11-25 — End: ?

## 2023-11-25 MED ORDER — BUSPIRONE HCL 5 MG PO TABS
5.0000 mg | ORAL_TABLET | Freq: Three times a day (TID) | ORAL | 5 refills | Status: AC
Start: 2023-11-25 — End: ?

## 2023-11-25 NOTE — Patient Instructions (Signed)
 We did not make any medication changes today. Keep up the good work with cutting back on caffeine  and nicotine.
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
## 2024-02-13 ENCOUNTER — Other Ambulatory Visit: Payer: Self-pay | Admitting: Medical Genetics

## 2024-04-16 ENCOUNTER — Other Ambulatory Visit: Payer: Self-pay | Admitting: Medical Genetics

## 2024-04-16 ENCOUNTER — Encounter: Payer: Self-pay | Admitting: *Deleted

## 2024-04-16 DIAGNOSIS — Z006 Encounter for examination for normal comparison and control in clinical research program: Secondary | ICD-10-CM
